# Patient Record
Sex: Female | Born: 1935 | Race: White | Hispanic: No | Smoking: Current some day smoker
Health system: Southern US, Community
[De-identification: ages and names within clinical notes are randomized; demographics above are authoritative.]

## PROBLEM LIST (undated history)

## (undated) DIAGNOSIS — B029 Zoster without complications: Secondary | ICD-10-CM

## (undated) DIAGNOSIS — M169 Osteoarthritis of hip, unspecified: Secondary | ICD-10-CM

## (undated) DIAGNOSIS — F0390 Unspecified dementia without behavioral disturbance: Principal | ICD-10-CM

## (undated) DIAGNOSIS — I951 Orthostatic hypotension: Secondary | ICD-10-CM

## (undated) DIAGNOSIS — G3184 Mild cognitive impairment, so stated: Secondary | ICD-10-CM

## (undated) DIAGNOSIS — C4491 Basal cell carcinoma of skin, unspecified: Secondary | ICD-10-CM

## (undated) DIAGNOSIS — S52509A Unspecified fracture of the lower end of unspecified radius, initial encounter for closed fracture: Secondary | ICD-10-CM

## (undated) DIAGNOSIS — M48061 Spinal stenosis, lumbar region without neurogenic claudication: Secondary | ICD-10-CM

## (undated) DIAGNOSIS — Z136 Encounter for screening for cardiovascular disorders: Secondary | ICD-10-CM

## (undated) DIAGNOSIS — M461 Sacroiliitis, not elsewhere classified: Secondary | ICD-10-CM

## (undated) DIAGNOSIS — I10 Essential (primary) hypertension: Secondary | ICD-10-CM

## (undated) DIAGNOSIS — M1711 Unilateral primary osteoarthritis, right knee: Secondary | ICD-10-CM

## (undated) DIAGNOSIS — F1910 Other psychoactive substance abuse, uncomplicated: Secondary | ICD-10-CM

## (undated) DIAGNOSIS — D7589 Other specified diseases of blood and blood-forming organs: Secondary | ICD-10-CM

## (undated) DIAGNOSIS — Z85828 Personal history of other malignant neoplasm of skin: Secondary | ICD-10-CM

## (undated) DIAGNOSIS — Z7409 Other reduced mobility: Secondary | ICD-10-CM

## (undated) HISTORY — DX: Unspecified dementia without behavioral disturbance: F03.90

## (undated) HISTORY — DX: Spinal stenosis, lumbar region without neurogenic claudication: M48.061

## (undated) HISTORY — DX: Orthostatic hypotension: I95.1

## (undated) HISTORY — DX: Encounter for screening for cardiovascular disorders: Z13.6

## (undated) HISTORY — DX: Other specified diseases of blood and blood-forming organs: D75.89

## (undated) HISTORY — DX: Essential (primary) hypertension: I10

## (undated) HISTORY — DX: Personal history of other malignant neoplasm of skin: Z85.828

## (undated) HISTORY — DX: Mild cognitive impairment, so stated: G31.84

## (undated) HISTORY — DX: Unilateral primary osteoarthritis, right knee: M17.11

## (undated) HISTORY — DX: Zoster without complications: B02.9

## (undated) HISTORY — DX: Other reduced mobility: Z74.09

## (undated) HISTORY — DX: Unspecified fracture of the lower end of unspecified radius, initial encounter for closed fracture: S52.509A

## (undated) HISTORY — DX: Osteoarthritis of hip, unspecified: M16.9

## (undated) HISTORY — DX: Other psychoactive substance abuse, uncomplicated: F19.10

## (undated) HISTORY — PX: BILROTH I PROCEDURE: SHX1231

## (undated) HISTORY — DX: Basal cell carcinoma of skin, unspecified: C44.91

## (undated) HISTORY — DX: Sacroiliitis, not elsewhere classified: M46.1

---

## 1997-08-19 ENCOUNTER — Encounter (INDEPENDENT_AMBULATORY_CARE_PROVIDER_SITE_OTHER): Payer: Self-pay | Admitting: *Deleted

## 1997-08-19 LAB — CONVERTED CEMR LAB

## 1998-06-06 ENCOUNTER — Encounter: Admission: RE | Admit: 1998-06-06 | Discharge: 1998-06-06 | Payer: Self-pay | Admitting: Family Medicine

## 1999-05-15 ENCOUNTER — Encounter: Admission: RE | Admit: 1999-05-15 | Discharge: 1999-05-15 | Payer: Self-pay | Admitting: Sports Medicine

## 1999-06-08 ENCOUNTER — Encounter: Admission: RE | Admit: 1999-06-08 | Discharge: 1999-06-08 | Payer: Self-pay | Admitting: Family Medicine

## 1999-06-15 ENCOUNTER — Encounter: Admission: RE | Admit: 1999-06-15 | Discharge: 1999-06-15 | Payer: Self-pay | Admitting: Family Medicine

## 1999-08-06 ENCOUNTER — Encounter: Admission: RE | Admit: 1999-08-06 | Discharge: 1999-08-06 | Payer: Self-pay | Admitting: Family Medicine

## 2000-05-19 DIAGNOSIS — Z136 Encounter for screening for cardiovascular disorders: Secondary | ICD-10-CM

## 2000-05-19 HISTORY — DX: Encounter for screening for cardiovascular disorders: Z13.6

## 2000-10-10 ENCOUNTER — Encounter: Admission: RE | Admit: 2000-10-10 | Discharge: 2000-10-10 | Payer: Self-pay | Admitting: Family Medicine

## 2000-10-17 DIAGNOSIS — M1711 Unilateral primary osteoarthritis, right knee: Secondary | ICD-10-CM

## 2000-10-17 HISTORY — DX: Unilateral primary osteoarthritis, right knee: M17.11

## 2000-10-18 ENCOUNTER — Encounter: Payer: Self-pay | Admitting: Family Medicine

## 2000-10-18 ENCOUNTER — Encounter: Admission: RE | Admit: 2000-10-18 | Discharge: 2000-10-18 | Payer: Self-pay | Admitting: Family Medicine

## 2000-10-20 ENCOUNTER — Encounter: Payer: Self-pay | Admitting: Family Medicine

## 2000-10-20 ENCOUNTER — Encounter: Admission: RE | Admit: 2000-10-20 | Discharge: 2000-10-20 | Payer: Self-pay | Admitting: Family Medicine

## 2000-10-24 ENCOUNTER — Encounter: Admission: RE | Admit: 2000-10-24 | Discharge: 2000-10-24 | Payer: Self-pay | Admitting: Family Medicine

## 2000-12-01 ENCOUNTER — Encounter: Admission: RE | Admit: 2000-12-01 | Discharge: 2000-12-01 | Payer: Self-pay | Admitting: Family Medicine

## 2001-06-09 ENCOUNTER — Encounter: Admission: RE | Admit: 2001-06-09 | Discharge: 2001-06-09 | Payer: Self-pay | Admitting: Family Medicine

## 2003-05-13 ENCOUNTER — Encounter: Admission: RE | Admit: 2003-05-13 | Discharge: 2003-05-13 | Payer: Self-pay | Admitting: Family Medicine

## 2004-06-04 ENCOUNTER — Ambulatory Visit: Payer: Self-pay | Admitting: Family Medicine

## 2004-06-04 ENCOUNTER — Ambulatory Visit (HOSPITAL_COMMUNITY): Admission: RE | Admit: 2004-06-04 | Discharge: 2004-06-04 | Payer: Self-pay | Admitting: Family Medicine

## 2005-05-20 ENCOUNTER — Ambulatory Visit: Payer: Self-pay | Admitting: Family Medicine

## 2006-03-24 ENCOUNTER — Ambulatory Visit: Payer: Self-pay | Admitting: Family Medicine

## 2006-05-17 ENCOUNTER — Ambulatory Visit: Payer: Self-pay | Admitting: Sports Medicine

## 2006-09-15 DIAGNOSIS — K649 Unspecified hemorrhoids: Secondary | ICD-10-CM | POA: Insufficient documentation

## 2006-09-15 DIAGNOSIS — M810 Age-related osteoporosis without current pathological fracture: Secondary | ICD-10-CM | POA: Insufficient documentation

## 2006-09-15 DIAGNOSIS — M1710 Unilateral primary osteoarthritis, unspecified knee: Secondary | ICD-10-CM | POA: Insufficient documentation

## 2006-09-15 DIAGNOSIS — F17200 Nicotine dependence, unspecified, uncomplicated: Secondary | ICD-10-CM

## 2006-09-15 DIAGNOSIS — E7800 Pure hypercholesterolemia, unspecified: Secondary | ICD-10-CM

## 2006-09-15 DIAGNOSIS — H81399 Other peripheral vertigo, unspecified ear: Secondary | ICD-10-CM | POA: Insufficient documentation

## 2006-09-16 ENCOUNTER — Encounter (INDEPENDENT_AMBULATORY_CARE_PROVIDER_SITE_OTHER): Payer: Self-pay | Admitting: *Deleted

## 2007-12-21 ENCOUNTER — Ambulatory Visit: Payer: Self-pay | Admitting: Family Medicine

## 2007-12-21 LAB — CONVERTED CEMR LAB
AST: 22 units/L (ref 0–37)
Albumin: 4.4 g/dL (ref 3.5–5.2)
BUN: 13 mg/dL (ref 6–23)
CO2: 24 meq/L (ref 19–32)
Calcium: 9.7 mg/dL (ref 8.4–10.5)
Chloride: 105 meq/L (ref 96–112)
Cholesterol: 218 mg/dL — ABNORMAL HIGH (ref 0–200)
Creatinine, Ser: 0.67 mg/dL (ref 0.40–1.20)
Glucose, Bld: 86 mg/dL (ref 70–99)
HCT: 41.4 % (ref 36.0–46.0)
HDL: 68 mg/dL (ref >39)
Hemoglobin: 13.8 g/dL (ref 12.0–15.0)
Potassium: 4.2 meq/L (ref 3.5–5.3)
RBC: 4.44 M/uL (ref 3.87–5.11)
Total CHOL/HDL Ratio: 3.2
Triglycerides: 241 mg/dL — ABNORMAL HIGH (ref <150)
WBC: 5.4 10*3/uL (ref 4.0–10.5)

## 2007-12-22 ENCOUNTER — Encounter: Payer: Self-pay | Admitting: Family Medicine

## 2007-12-22 DIAGNOSIS — I10 Essential (primary) hypertension: Secondary | ICD-10-CM

## 2007-12-27 ENCOUNTER — Telehealth: Payer: Self-pay | Admitting: Family Medicine

## 2008-01-03 ENCOUNTER — Telehealth (INDEPENDENT_AMBULATORY_CARE_PROVIDER_SITE_OTHER): Payer: Self-pay | Admitting: *Deleted

## 2008-01-09 ENCOUNTER — Encounter: Admission: RE | Admit: 2008-01-09 | Discharge: 2008-01-09 | Payer: Self-pay | Admitting: Family Medicine

## 2008-01-10 ENCOUNTER — Telehealth: Payer: Self-pay | Admitting: Family Medicine

## 2008-01-25 ENCOUNTER — Telehealth: Payer: Self-pay | Admitting: Family Medicine

## 2008-02-08 ENCOUNTER — Encounter: Payer: Self-pay | Admitting: Family Medicine

## 2008-03-04 ENCOUNTER — Telehealth: Payer: Self-pay | Admitting: *Deleted

## 2008-03-05 ENCOUNTER — Encounter: Payer: Self-pay | Admitting: Family Medicine

## 2008-03-05 ENCOUNTER — Ambulatory Visit: Payer: Self-pay | Admitting: Family Medicine

## 2008-03-06 LAB — CONVERTED CEMR LAB
BUN: 10 mg/dL (ref 6–23)
CO2: 23 meq/L (ref 19–32)
Calcium: 9.3 mg/dL (ref 8.4–10.5)
Glucose, Bld: 98 mg/dL (ref 70–99)
Sodium: 138 meq/L (ref 135–145)

## 2008-04-15 ENCOUNTER — Ambulatory Visit: Payer: Self-pay | Admitting: Family Medicine

## 2008-06-18 HISTORY — PX: CATARACT EXTRACTION W/ INTRAOCULAR LENS IMPLANT: SHX1309

## 2008-08-08 ENCOUNTER — Inpatient Hospital Stay (HOSPITAL_COMMUNITY): Admission: AD | Admit: 2008-08-08 | Discharge: 2008-08-11 | Payer: Self-pay | Admitting: Orthopedic Surgery

## 2008-11-16 HISTORY — PX: CATARACT EXTRACTION W/ INTRAOCULAR LENS IMPLANT: SHX1309

## 2009-04-18 DIAGNOSIS — B029 Zoster without complications: Secondary | ICD-10-CM

## 2009-04-18 HISTORY — DX: Zoster without complications: B02.9

## 2009-04-25 DIAGNOSIS — B029 Zoster without complications: Secondary | ICD-10-CM | POA: Insufficient documentation

## 2009-05-15 ENCOUNTER — Ambulatory Visit: Payer: Self-pay | Admitting: Family Medicine

## 2009-06-18 HISTORY — PX: ORIF HIP FRACTURE: SHX2125

## 2009-11-10 ENCOUNTER — Ambulatory Visit: Payer: Self-pay | Admitting: Family Medicine

## 2009-11-11 DIAGNOSIS — M48061 Spinal stenosis, lumbar region without neurogenic claudication: Secondary | ICD-10-CM

## 2009-11-11 HISTORY — DX: Spinal stenosis, lumbar region without neurogenic claudication: M48.061

## 2010-03-02 ENCOUNTER — Encounter: Payer: Self-pay | Admitting: Family Medicine

## 2010-03-02 ENCOUNTER — Telehealth: Payer: Self-pay | Admitting: Family Medicine

## 2010-03-27 ENCOUNTER — Encounter: Payer: Self-pay | Admitting: Family Medicine

## 2010-03-30 ENCOUNTER — Encounter: Payer: Self-pay | Admitting: Family Medicine

## 2010-04-30 ENCOUNTER — Ambulatory Visit: Payer: Self-pay | Admitting: Family Medicine

## 2010-04-30 DIAGNOSIS — Z85828 Personal history of other malignant neoplasm of skin: Secondary | ICD-10-CM | POA: Insufficient documentation

## 2010-04-30 DIAGNOSIS — R5381 Other malaise: Secondary | ICD-10-CM | POA: Insufficient documentation

## 2010-04-30 DIAGNOSIS — R5383 Other fatigue: Secondary | ICD-10-CM

## 2010-04-30 HISTORY — DX: Personal history of other malignant neoplasm of skin: Z85.828

## 2010-04-30 LAB — CONVERTED CEMR LAB
AST: 21 units/L (ref 0–37)
BUN: 14 mg/dL (ref 6–23)
Calcium: 9.5 mg/dL (ref 8.4–10.5)
Chloride: 107 meq/L (ref 96–112)
Creatinine, Ser: 0.58 mg/dL (ref 0.40–1.20)
HCT: 38.5 % (ref 36.0–46.0)
HDL: 58 mg/dL (ref >39)
Hemoglobin: 13.2 g/dL (ref 12.0–15.0)
LDL Cholesterol: 103 mg/dL — ABNORMAL HIGH (ref 0–99)
MCV: 94.1 fL (ref 78.0–100.0)
RDW: 13 % (ref 11.5–15.5)
TSH: 2.215 microintl units/mL (ref 0.350–4.500)
Total Bilirubin: 0.6 mg/dL (ref 0.3–1.2)
Total CHOL/HDL Ratio: 3.7
WBC: 5.3 10*3/uL (ref 4.0–10.5)

## 2010-05-01 ENCOUNTER — Encounter: Payer: Self-pay | Admitting: Family Medicine

## 2010-05-15 ENCOUNTER — Telehealth: Payer: Self-pay | Admitting: Family Medicine

## 2010-08-18 NOTE — Assessment & Plan Note (Signed)
Summary: f/u,df   Vital Signs:  Patient profile:   77 year old female Height:      61.5 inches Weight:      129.6 pounds BMI:     24.18 Pulse rate:   96 / minute BP sitting:   135 / 76  (left arm)  Vitals Entered By: Arlyss Repress CMA, (November 10, 2009 2:17 PM) CC: regular visit. check up. blood work. hx of left hip fx Is Patient Diabetic? No Pain Assessment Patient in pain? no        CC:  regular visit. check up. blood work. hx of left hip fx.  History of Present Illness: No major concers except intermittent right low back pain BACK PAIN Location:Right low back over iliac crest Quality:aching Onset:months to years ago Severity (1-10): worst is 6 for short period Worse with:standing Better with:lying down or sitting Radiation:no Trauma:no Best sitting/standing/leaning forward:no change  Red Flags Fecal/urinary incontinence:no Numbness/Weakness:no Fever/chills/sweats:no Night pain:no Unexplained weight loss:no No relief with bedrest:yes h/o cancer/immunosuppression:no IV drug use:no PMH of osteoporosis or chronic steroid use:yes  PMH significant for hx of right sacroilliitis and Left Hip ORIF for subcapital hip fracture (12/10)   Habits & Providers  Alcohol-Tobacco-Diet     Alcohol drinks/day: 0     Tobacco Status: current     Tobacco Counseling: to quit use of tobacco products  Comments: smokes sometimes, when she is upset  Current Medications (verified): 1)  Vitamin C 500 Mg  Tabs (Ascorbic Acid) 2)  Calcium Citrate-Vitamin D 315-200 Mg-Unit  Tabs (Calcium Citrate-Vitamin D) 3)  Multivitamins   Tabs (Multiple Vitamin) .Marland Kitchen.. 1 Daily 4)  Tramadol Hcl 50 Mg Tabs (Tramadol Hcl) .... One Tablet By Mouth Every Six Hours, If Needed, For Arthritis Pain  Allergies: 1)  Vioxx 2)  Hydrochlorothiazide PMH reviewed for relevance, PSH reviewed for relevance  Social History: Smoking Status:  current  Physical Exam  General:  alert and well-developed.   thoracic Kyphosis Lungs:  Normal respiratory effort, . Lungs are clear to auscultation, no crackles or wheezes. Heart:  normal rate, regular rhythm, and no murmur.   Abdomen:  soft and non-tender.   Pulses:  R dorsalis pedis normal and L dorsalis pedis normal.   Extremities:  No peripheral edema Neurologic:  gait normal.   DTR knee and ankles normal  Skin:  Right face malar with hypopigmented thicken  ~1 cm diameter plaque containing a central  telangectasia and an adjoining red papule. Nontender.  Psych:  memory intact for recent and remote, normally interactive, good eye contact, not anxious appearing, and not depressed appearing.  Well groomed   Impression & Recommendations:  Problem # 1:  BACK PAIN, LUMBAR, CHRONIC (ICD-724.2) Assessment Comment Only  Nonspecific low back pain without red flags.   Continue intermittent use of APAP as needed. Trial of Tramadol 1 tab every 6 hours as needed.  If tolerates and finds it helpful patient may request further refills.  Her updated medication list for this problem includes:    Tramadol Hcl 50 Mg Tabs (Tramadol hcl) ..... One tablet by mouth every six hours, if needed, for arthritis pain  Orders: [facility]- Est Level  3 (11914)  Problem # 2:  HYPERTENSION, BENIGN ESSENTIAL (ICD-401.1) Assessment: Unchanged  Adequate control. Tolerating medication. No new organ damage. Plan to continue life styel management.  Orders: [facility]- Est Level  3 ([id number])  Problem # 3:  ? of NEOPLASM, MALIGNANT, SKIN (ICD-173.9)  Right facial malar lesion that patient noted for months  to years, wthat she periodically "picks off" the surface. Ddx includes superficial basal cell or squamous cell cancer. I let Ms Basham know of my concern for possible malignant nature of this facial lesion.  I offered to refer her to dermatologist for evaluation.    she plans to ask her sister' oncologist to look at the lesion when she takes her to see him in the next couple weeks.  She  said she would let me know if she wanted to pursue a dermatologic consultation.  Orders: [facility]- Est Level  3 (69629)  Complete Medication List: 1)  Vitamin C 500 Mg Tabs (Ascorbic acid) 2)  Calcium Citrate-vitamin D 315-200 Mg-unit Tabs (Calcium citrate-vitamin d) 3)  Multivitamins Tabs (Multiple vitamin) .Marland Kitchen.. 1 daily 4)  Tramadol Hcl 50 Mg Tabs (Tramadol hcl) .... One tablet by mouth every six hours, if needed, for arthritis pain  Patient Instructions: 1)  Please schedule a follow-up appointment in 6 months . 2)  Try the Tramadol tablet, one tablet every six hours if you need it for arthritis pain.  If the tramadol helps you without causing side effects, I will be glad to refill the tramadol.  3)  I am concerned that the thickening of skin on your right face cheek could be a basal skin cancer.  I would be glad to arrange an appointment with a Dermatologist, if you like.  Prescriptions: TRAMADOL HCL 50 MG TABS (TRAMADOL HCL) One tablet by mouth every six hours, if needed, for arthritis pain  #10 x 0   Entered and Authorized by:   Tawanna Cooler Jaxson Keener MD   Signed by:   Tawanna Cooler Mckade Gurka MD on 11/11/2009   Method used:   Handwritten   RxID:   [id number]    Prevention & Chronic Care Immunizations   Influenza vaccine: Fluvax MCR  (05/15/2009)   Influenza vaccine due: 04/15/2009    Tetanus booster: 04/15/2008: given   Tetanus booster due: 04/15/2018    Pneumococcal vaccine: Done.  (10/17/1996)   Pneumococcal vaccine deferral: Not indicated  (05/15/2009)   Pneumococcal vaccine due: None    H. zoster vaccine: Not documented  Colorectal Screening   Hemoccult: Done.  (05/19/2005)   Hemoccult due: Not Indicated    Colonoscopy: Normal  (02/08/2008)   Colonoscopy due: 02/07/2018  Other Screening   Pap smear: Done.  (08/19/1997)   Pap smear action/deferral: not indicated  (05/15/2009)   Pap smear due: Not Indicated    Mammogram: refused  (05/15/2009)   Mammogram action/deferral:  patient declined  (05/15/2009)   Mammogram due: Refused  (05/15/2009)    DXA bone density scan: Not documented   DXA scan due: Not Indicated    Smoking status: current  (11/10/2009)   Smoking cessation counseling: yes  (12/21/2007)  Lipids   Total Cholesterol: 218  (12/21/2007)   LDL: 102  (12/21/2007)   LDL Direct: Not documented   HDL: 68  (12/21/2007)   Triglycerides: 241  (12/21/2007)    SGOT (AST): 22  (12/21/2007)   SGPT (ALT): 16  (12/21/2007)   Alkaline phosphatase: 57  (12/21/2007)   Total bilirubin: 0.5  (12/21/2007)    Lipid flowsheet reviewed?: Yes   Progress toward LDL goal: At goal  Hypertension   Last Blood Pressure: 135 / 76  (11/10/2009)   Serum creatinine: 0.58  (03/05/2008)   Serum potassium 4.0  (03/05/2008)    Hypertension flowsheet reviewed?: Yes   Progress toward BP goal: At goal  Self-Management Support :   Personal Goals (by the  next clinic visit) :      Personal blood pressure goal: 160/90  (05/15/2009)     Personal LDL goal: 160  (05/15/2009)    Hypertension self-management support: Not documented    Hypertension self-management support not done because: Good outcomes  (11/10/2009)    Lipid self-management support: Not documented     Lipid self-management support not done because: Good outcomes  (11/10/2009)

## 2010-08-18 NOTE — Progress Notes (Signed)
Summary: pls call   Phone Note Call from Patient Call back at Home Phone [phone]   Caller: Patient Summary of Call: wants to talk to dr Levonne Lapping about results from other doctors Initial call taken by: De Nurse,  March 02, 2010 2:37 PM  Follow-up for Phone Call        No answer at [phone] Follow-up by: Tawanna Cooler Saniyyah Elster MD,  March 02, 2010 3:38 PM  Additional Follow-up for Phone Call Additional follow up Details #1::        No answer at [phone] Additional Follow-up by: Tawanna Cooler Magally Vahle MD,  March 03, 2010 11:24 AM    Additional Follow-up for Phone Call Additional follow up Details #2::    seen by "Back doctor". Told there was no surgery for her back.  he recommended a 'shot in the back" and said a doctor would call  her about the shot.  she is not interested in the shot currently. she says the tramadol makes her feel drunk. Will see pt in October.  Follow-up by: Tawanna Cooler Brenya Taulbee MD,  March 04, 2010 9:27 AM

## 2010-08-18 NOTE — Consult Note (Signed)
Summary: [facility]  SM&OC   Imported By: Clydell Hakim 04/07/2010 12:27:12  _____________________________________________________________________  External Attachment:    Type:   Image     Comment:   External Document

## 2010-08-18 NOTE — Consult Note (Signed)
Summary: [facility]  SM&OC   Imported By: Clydell Hakim 03/06/2010 15:21:45  _____________________________________________________________________  External Attachment:    Type:   Image     Comment:   External Document

## 2010-08-18 NOTE — Progress Notes (Signed)
Summary: fyi delay in derm consultation   Phone Note Call from Patient Call back at Home Phone [phone]   Caller: Patient Summary of Call: just wanted Dr Levonne Lapping to know that she has to resch her appt w/ Derm b/c her son just had a stroke and has to go to [place] to see him. Initial call taken by: De Nurse,  May 15, 2010 10:07 AM

## 2010-08-18 NOTE — Assessment & Plan Note (Signed)
Summary: F/U VISIT/[facility]   FLU SHOT GIVEN TODAY.Jimmy Footman, CMA  April 30, 2010 10:54 AM   Vital Signs:  Patient profile:   77 year old female Height:      61.5 inches Weight:      130.2 pounds BMI:     24.29 Temp:     98.5 degrees F oral Pulse rate:   104 / minute BP sitting:   145 / 85  (right arm) Cuff size:   regular  Vitals Entered By: Jimmy Footman, CMA (April 30, 2010 10:04 AM) CC: f/u, wants flu shot Is Patient Diabetic? No Pain Assessment Patient in pain? no        CC:  f/u and wants flu shot.  History of Present Illness: HYPERTENSION Disease Monitoring   Blood pressure range:not checking at home   Chest pain:no   Dyspnea:no   Claudication:no  Medications   Compliance:not on medications for hypertension  Prevention   Exercise: active with dogs.  Looking after her 34 year old aunt.   Diet pattern:watches weight   Salt restriction:aware of salt content of foods.   Back pain, chronic from osteoarthritis, foraminal stenosis and Scoliosis Pain only occassionally.  Usually able to control pain with rest and either Acetaminophen or occassioanal half talbet of tramadol  Right facial cheek lesion Pt feels it has regressed since applying a medication to the lesion that her relative makes at home.      Habits & Providers  Alcohol-Tobacco-Diet     Alcohol drinks/day: 0     Tobacco Status: current  Current Medications (verified): 1)  Vitamin C 500 Mg  Tabs (Ascorbic Acid) 2)  Calcium Citrate-Vitamin D 315-200 Mg-Unit  Tabs (Calcium Citrate-Vitamin D) 3)  Multivitamins   Tabs (Multiple Vitamin) .Marland Kitchen.. 1 Daily 4)  Tramadol Hcl 50 Mg Tabs (Tramadol Hcl) .... Half A Tablet By Mouth Every Six Hours, If Needed, For Arthritis Pain  Allergies (verified): 1)  Vioxx 2)  Hydrochlorothiazide  Past History:  Past medical history reviewed for relevance to current acute and chronic problems. Past surgical history reviewed for relevance to current acute and chronic  problems.  Past Medical History: Reviewed history from 05/15/2009 and no changes required. Alcoholic Abuse, Very Distant,  Sacroiliitis, R. Colorectal Cancer screening Colonoscopy 3/99 Elsie Amis -, Son with Colon Cancer Coloractal Cancer screening Colonoscopy 2009 - normal Shingles attack  (04/2009)  Past Surgical History: Left Hip ORIF for subcapital hip fracture (12/10)- Dr Thomes Dinning) Cataract extraction and lens implants Left eye 06/2008 and right eye 11/2008 by surgeon at [facility].  S/p Bilroth(partial gastrectomy) for Ulcer,  ETT Dr. Hillary Bow 11/01-adequate and low pb CAD - 06/02/2000,  R. knee & R. Hip XRay: medial narrow&patella spur - 10/27/2000,  R. SI Joint injection w/steroid with good results - 12/07/2000  Physical Exam  General:  alert.  kyphotic.  Cooperative. Well-groomed.  Ears:  mild TM scarring. No bulging or erythema.  Neck:  supple and no masses.   Lungs:  Normal respiratory effort, chest expands symmetrically. Lungs are clear to auscultation, no crackles or wheezes. Heart:  normal rate, regular rhythm, no murmur, and no JVD.   Abdomen:  soft and non-tender.   Pulses:  R dorsalis pedis normal and L dorsalis pedis normal.   Extremities:  No peripheral edema.  Skin:  thicken waxy firm skin area  ~ 1 to 1.5 cm right facial malar area with an erratic telangectasia on lower border of lesion.    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  I had asked patient last visit about consulting a dermatologist for this lesion on last visit.  She declined.  She is now willing to consult a Dermatologist.  We arranged consultation with Dr Pearletha [facility]) for evaluation and treatment of this lesion.  Orders: Dermatology Referral (Derma) [facility]- Est  Level 4 (16109)  Problem # 2:   HYPERTENSION, BENIGN ESSENTIAL (ICD-401.1)  Adequately treated with lifestyle interventions.  No evidence of new end organ injury.  Will monitor for now. RTC in 6 months.  Orders: [facility]- Est  Level 4 (60454)  Problem # 3:  FATIGUE (ICD-780.79)  Nonspecific complaint.  No red flags except facial skin lesion. Checking Lipids, CMET, CBC, TSH.   Orders: Comp Met-[facility] [phone]) TSH-[facility] [phone]) CBC-[facility] ([id number])  Problem # 4:  SHINGLES (ICD-053.9) Assessment: Comment Only Pt with history of shingles.  She is interested in getting Zostavax.  Pt ed material and prescription to obtain Zostavax with administration at [facility].   Problem # 5:  BACK PAIN, LUMBAR, CHRONIC (ICD-724.2)  Adequate pain control with continued ability to perform iADLs with minimal analgesics of APAP or half tablet Tramadol  Her updated medication list for this problem includes:    Tramadol Hcl 50 Mg Tabs (Tramadol hcl) ..... Half a tablet by mouth every six hours, if needed, for arthritis pain  Orders: [facility]- Est  Level 4 ([id number])  Problem # 6:  HYPERCHOLESTEROLEMIA (ICD-272.0) Assessment: Comment Only Check nonfasting LDL-C today.  Orders: Lipid-[facility] ([id number]) TSH-[facility] ([id number])  Complete Medication List: 1)  Vitamin C 500 Mg Tabs (Ascorbic acid) 2)  Calcium Citrate-vitamin D 315-200 Mg-unit Tabs (Calcium citrate-vitamin d) 3)  Multivitamins Tabs (Multiple vitamin) .Marland Kitchen.. 1 daily 4)  Tramadol Hcl 50 Mg Tabs (Tramadol hcl) .... Half a tablet by mouth every six hours, if needed, for arthritis pain  Other Orders: Influenza Vaccine MCR ([id number])   Influenza Vaccine    Vaccine Type: Fluvax MCR    Site: left deltoid    Mfr: Sanofi Pasteur    Dose: 0.5 ml    Route: IM    Given by: Jimmy Footman, CMA    Exp. Date: 01/13/2011    Lot #: [id number]    VIS given: 02/10/10 version given April 30, 2010.  Flu Vaccine Consent Questions    Do you have a history of severe allergic reactions to this  vaccine? no    Any prior history of allergic reactions to egg and/or gelatin? no    Do you have a sensitivity to the preservative Thimersol? no    Do you have a past history of Guillan-Barre Syndrome? no    Do you currently have an acute febrile illness? no    Have you ever had a severe reaction to latex? no    Vaccine information given and explained to patient? yes    Are you currently pregnant? no    Prevention & Chronic Care Immunizations   Influenza vaccine: Fluvax MCR  (04/30/2010)   Influenza vaccine due: 04/15/2009    Tetanus booster: 04/15/2008: given   Tetanus booster due: 04/15/2018    Pneumococcal vaccine: Done.  (10/17/1996)   Pneumococcal vaccine deferral: Not indicated  (05/15/2009)   Pneumococcal vaccine due: None    H. zoster vaccine: Not documented  Colorectal Screening   Hemoccult: Done.  (05/19/2005)   Hemoccult due: Not Indicated  Colonoscopy: Normal  (02/08/2008)   Colonoscopy due: 02/07/2018  Other Screening   Pap smear: Done.  (08/19/1997)   Pap smear action/deferral: not indicated  (05/15/2009)   Pap smear due: Not Indicated    Mammogram: refused  (05/15/2009)   Mammogram action/deferral: patient declined  (05/15/2009)   Mammogram due: Refused  (05/15/2009)    DXA bone density scan: Not documented   DXA scan due: Not Indicated    Smoking status: current  (04/30/2010)   Smoking cessation counseling: yes  (12/21/2007)  Lipids   Total Cholesterol: 218  (12/21/2007)   LDL: 102  (12/21/2007)   LDL Direct: Not documented   HDL: 68  (12/21/2007)   Triglycerides: 241  (12/21/2007)    SGOT (AST): 22  (12/21/2007)   SGPT (ALT): 16  (12/21/2007) CMP ordered    Alkaline phosphatase: 57  (12/21/2007)   Total bilirubin: 0.5  (12/21/2007)    Lipid flowsheet reviewed?: Yes   Progress toward LDL goal: At goal  Hypertension   Last Blood Pressure: 145 / 85  (04/30/2010)   Serum creatinine: 0.58  (03/05/2008)   Serum potassium 4.0   (03/05/2008) CMP ordered     Hypertension flowsheet reviewed?: Yes   Progress toward BP goal: At goal  Self-Management Support :   Personal Goals (by the next clinic visit) :      Personal blood pressure goal: 160/90  (05/15/2009)     Personal LDL goal: 130  (04/30/2010)    Hypertension self-management support: Not documented    Hypertension self-management support not done because: Good outcomes  (04/30/2010)    Lipid self-management support: Not documented     Lipid self-management support not done because: Good outcomes  (04/30/2010)

## 2010-08-18 NOTE — Letter (Signed)
Summary: [facility] Lipid Letter  [facility]  [address]   [place], [place] [address]   Phone: [phone]  Fax: [phone]    05/01/2010 MRN: [id number]  Kathryn [name] [address] [place] [address] [place]  [address]  Dear [name]:  I reviewed your last lipid profile from 04/30/2010 and the results are noted below with a summary of recommendations for lipid management. Your Cholesterol panel looks very good.  You have plenty of good HDL cholesterol and not very much bad LDL cholesterol.   The Triglycerides were only slightly high.  Triglycerides cause only a minor risk for heart disease.  Triglycerides can also be slightly high if you had not been fasting for 8 hours prior to this blood draw.   I recommend rechecking your cholesterol in a 2 years.  Keep up the good work.    Cholesterol:       213     Goal: <240   HDL "good" Cholesterol:   58     Goal: >40   LDL "bad" Cholesterol:   103     Goal: <130   Triglycerides:       258     Goal: <150       TLC Diet (Therapeutic Lifestyle Change): Saturated Fats & Transfatty acids should be kept < 7% of total calories ***Reduce Saturated Fats Polyunstaurated Fat can be up to 10% of total calories Monounsaturated Fat Fat can be up to 20% of total calories Total Fat should be no greater than 25-35% of total calories Carbohydrates should be 50-60% of total calories Protein should be approximately 15% of total calories Fiber should be at least 20-30 grams a day ***Increased fiber may help lower LDL Total Cholesterol should be < 200mg /day Consider adding plant stanol/sterols to diet (example: Benacol spread) ***A higher intake of unsaturated fat may reduce Triglycerides and Increase HDL   If you have any questions, please call. We appreciate being able to work with you.   Sincerely,   Kathryn [name] Kathryn Fogleman MD [facility] Kathryn [name] Kathryn Etheredge MD     Appended Document: [facility] Lipid Letter mailed.

## 2010-08-18 NOTE — Letter (Signed)
Summary: Reminder to make check-up appointment with McDiarmid  [facility]  [address]   [place], [place] [address]   Phone: [phone]  Fax: [phone]    03/27/2010 MRN: [id number]  [address] [place], [place]  [address]  Dear Ms. Barbuto,  I hope you are doing well. I wanted to remind you to call the [facility] to schedule a check-up appointment for October with me.   Sincerely,   Tawanna Cooler McDiarmid MD [facility]  Appended Document: Reminder to make check-up appointment with McDiarmid mailed.

## 2010-08-24 ENCOUNTER — Encounter: Payer: Self-pay | Admitting: *Deleted

## 2010-08-28 ENCOUNTER — Encounter: Payer: Self-pay | Admitting: Home Health Services

## 2010-10-12 ENCOUNTER — Encounter: Payer: Self-pay | Admitting: Family Medicine

## 2010-10-12 ENCOUNTER — Ambulatory Visit (INDEPENDENT_AMBULATORY_CARE_PROVIDER_SITE_OTHER): Payer: Medicare Other | Admitting: Family Medicine

## 2010-10-12 DIAGNOSIS — I10 Essential (primary) hypertension: Secondary | ICD-10-CM

## 2010-10-12 DIAGNOSIS — C4491 Basal cell carcinoma of skin, unspecified: Secondary | ICD-10-CM

## 2010-10-13 ENCOUNTER — Encounter: Payer: Self-pay | Admitting: Family Medicine

## 2010-10-13 DIAGNOSIS — C4491 Basal cell carcinoma of skin, unspecified: Secondary | ICD-10-CM | POA: Insufficient documentation

## 2010-10-13 NOTE — Assessment & Plan Note (Signed)
Adequate blood pressure control.  No evidence of new end organ damage.   Plan to continue lifestyle management of blood pressure.  Will need BMET and LDL next OV in 6 months.

## 2010-10-13 NOTE — Progress Notes (Signed)
  Subjective:    Patient ID: Kathryn [name], female    DOB: February 21, 1925, 77 y.o.   MRN: [id number]  HPI HYPERTENSION  Disease Monitoring  Blood pressure range:not checking at home  Chest pain:no  Dyspnea:no  Claudication:no  Medications  Compliance:not on medications for hypertension  Prevention  Exercise: active with dogs. Looking after her 59 year old aunt.  Diet pattern:watches weight  Salt restriction:aware of salt content of foods.  Back pain, chronic from osteoarthritis, foraminal stenosis and Scoliosis  Pain only occassionally. Usually able to control pain with rest and either Acetaminophen  Right facial cheek lesion S/P excision by Dr Lupton(Derm)- some form of skin caner "that grows out, not in"  May have bee a Basal Cell cancer.   Back pain, chronic form osteoarthritis, foraminal stenosis and scoliosis Pain across low back intermittently,  Improves with rest and heat Occasionally takes tylenol with good pain control Minimal right knee pain.   Medications, past medical history,  family history, social history were reviewed and updated.     Review of Systems     Objective:   Physical Exam        Assessment & Plan:

## 2010-11-02 LAB — COMPREHENSIVE METABOLIC PANEL
ALT: 16 U/L (ref 0–35)
AST: 26 U/L (ref 0–37)
Albumin: 4 g/dL (ref 3.5–5.2)
Alkaline Phosphatase: 88 U/L (ref 39–117)
Potassium: 3.8 mEq/L (ref 3.5–5.1)
Sodium: 140 mEq/L (ref 135–145)
Total Protein: 6.8 g/dL (ref 6.0–8.3)

## 2010-11-02 LAB — TYPE AND SCREEN: Antibody Screen: NEGATIVE

## 2010-11-02 LAB — CBC
HCT: 33.4 % — ABNORMAL LOW (ref 36.0–46.0)
HCT: 34 % — ABNORMAL LOW (ref 36.0–46.0)
Hemoglobin: 11.1 g/dL — ABNORMAL LOW (ref 12.0–15.0)
Hemoglobin: 11.6 g/dL — ABNORMAL LOW (ref 12.0–15.0)
MCHC: 33.1 g/dL (ref 30.0–36.0)
MCV: 95.7 fL (ref 78.0–100.0)
Platelets: 240 10*3/uL (ref 150–400)
RBC: 3.49 MIL/uL — ABNORMAL LOW (ref 3.87–5.11)
RDW: 12.9 % (ref 11.5–15.5)
RDW: 13.4 % (ref 11.5–15.5)
WBC: 5.6 10*3/uL (ref 4.0–10.5)

## 2010-11-02 LAB — GLUCOSE, CAPILLARY

## 2010-11-02 LAB — BASIC METABOLIC PANEL
CO2: 27 mEq/L (ref 19–32)
Chloride: 102 mEq/L (ref 96–112)
GFR calc Af Amer: >60 mL/min (ref >60)
GFR calc non Af Amer: >60 mL/min (ref >60)
Glucose, Bld: 101 mg/dL — ABNORMAL HIGH (ref 70–99)
Potassium: 3.7 mEq/L (ref 3.5–5.1)
Potassium: 3.7 mEq/L (ref 3.5–5.1)
Sodium: 133 mEq/L — ABNORMAL LOW (ref 135–145)
Sodium: 138 mEq/L (ref 135–145)

## 2010-11-02 LAB — URINE MICROSCOPIC-ADD ON

## 2010-11-02 LAB — URINALYSIS, ROUTINE W REFLEX MICROSCOPIC
Hgb urine dipstick: NEGATIVE
Protein, ur: NEGATIVE mg/dL
Urobilinogen, UA: 1 mg/dL (ref 0.0–1.0)

## 2010-11-02 LAB — ABO/RH: ABO/RH(D): A POS

## 2010-11-02 LAB — PROTIME-INR: INR: 1 (ref 0.00–1.49)

## 2010-12-01 NOTE — Op Note (Signed)
NAME:  Kathryn [name], Kathryn [name] NO.:  [id number]   MEDICAL RECORD NO.:  [id number]          PATIENT TYPE:  INP   LOCATION:  5019                         FACILITY:  [facility]   PHYSICIAN:  Dyke Brackett, M.D.    DATE OF BIRTH:  1925/02/10   DATE OF PROCEDURE:  DATE OF DISCHARGE:                               OPERATIVE REPORT   INDICATIONS FOR PROCEDURE:  This is an 77 year old who presented to the  office with a 2-week history of hip pain after a fall, seen outside  Urgent Care here.  X-rays were equivocal, but then read out by  Radiology, film was consistent with a femoral neck fracture in my office  with obvious hip pain.  X-rays showed a subtle, but really impacted  valgus neck fractures.  She was counseled the best chance of dealing  would be with cannulated screws.   PREOPERATIVE DIAGNOSIS:  Valgus impacted left femoral neck fracture.   POSTOPERATIVE DIAGNOSIS:  Valgus impacted left femoral neck fracture.   OPERATIONS:  Percutaneous pinning, left femoral neck fracture.   SURGEON:  Dyke Brackett, M.D.   GENERAL BLOOD LOSS:  Minimal.   DESCRIPTION OF SURGERY:  General anesthetic, placed on the fractured  table, foot slightly internally rotated.  A 45-cm incision was made just  below the lesser trochanter.  Splitting of the iliotibial band, three  7.3 Synthes screws were placed in a triangular-shaped fashion, and all  abutting the superior right main quadrant of the femoral head, which  compressed and stabilized the fracture nicely.  The wound was irrigated  and closed with 0 and 2-0 Vicryl, Marcaine without epinephrine 10 mL,  taken to recovery room in stable condition.      Dyke Brackett, M.D.  Electronically Signed     WDC/MEDQ  D:  08/09/2008  T:  08/10/2008  Job:  [id number]

## 2010-12-04 NOTE — Discharge Summary (Signed)
NAMEJADELIN, ENG                  ACCOUNT NO.:  [id number]   MEDICAL RECORD NO.:  [id number]          PATIENT TYPE:  INP   LOCATION:  5019                         FACILITY:  [facility]   PHYSICIAN:  Dyke Brackett, M.D.    DATE OF BIRTH:  15-Apr-1925   DATE OF ADMISSION:  08/08/2008  DATE OF DISCHARGE:  08/11/2008                               DISCHARGE SUMMARY   Date of procedure was on August 09, 2008, with left hip pinning done by  Dr. Madelon Lips, no PA assistant.   GENERAL HOSPITAL COURSE:  The patient was admitted to the hospital on  August 08, 2008, with 2 weeks of left hip pain, noted a nondisplaced  femoral neck fracture.  On August 09, 2008, the patient had a left hip  femoral neck pinning done by Dr. Madelon Lips.  The patient was transferred  to the PACU in stable condition and then up to 5000 orthopedic floor.  Hospital course on first postop day #1, the patient was stable, vital  signs stable, afebrile, hemoglobin 11.6, PT 15.1, INR 1.2.  The patient  alert and oriented x3, doing well.  On postoperative day #2 was August 11, 2008, the patient had minimal pain up, walking well, 50%  weightbearing.  Vital signs were stable, afebrile, hemoglobin 11.1.  Left lower extremity neurovascularly intact.  The patient's plan  therefore was discharged home on August 11, 2008, on postoperative day  #2.   ASSESSMENT AND PLAN:  Ms. Luba is an 77 year old female status post  left femoral neck pinning discharged home 50% weightbearing.  Plan to  follow up in the office in about 10 days for staple removal.  Due to do  Home Health physical therapy until we see her in the office.  Discharged  home where vital signs stable, afebrile, and her hemoglobin stable at  11.1.   Discharge home medications were as follows:  1. Multivitamin.  2. Vitamin C.  3. Calcium.  4. Percocet 5/325 one to two tablets p.o. q.4-6 hours p.r.n. pain.  5. Robaxin 500 mg 1 tablet p.o. q.6-8 hours p.r.n. pain.  6.  Lovenox 40 mg subcu at 8 a.m. each day for a total of 14 days      postoperatively.      Sharol Given, PA      Dyke Brackett, M.D.  Electronically Signed    JBS/MEDQ  D:  09/20/2008  T:  09/20/2008  Job:  [id number]

## 2011-04-01 ENCOUNTER — Ambulatory Visit (INDEPENDENT_AMBULATORY_CARE_PROVIDER_SITE_OTHER): Payer: Medicare Other | Admitting: Family Medicine

## 2011-04-01 VITALS — BP 150/88 | HR 99 | Temp 97.9°F | Wt 127.4 lb

## 2011-04-01 DIAGNOSIS — H6590 Unspecified nonsuppurative otitis media, unspecified ear: Secondary | ICD-10-CM

## 2011-04-01 DIAGNOSIS — I10 Essential (primary) hypertension: Secondary | ICD-10-CM

## 2011-04-01 DIAGNOSIS — Z23 Encounter for immunization: Secondary | ICD-10-CM

## 2011-04-01 DIAGNOSIS — H905 Unspecified sensorineural hearing loss: Secondary | ICD-10-CM | POA: Insufficient documentation

## 2011-04-01 DIAGNOSIS — H6591 Unspecified nonsuppurative otitis media, right ear: Secondary | ICD-10-CM | POA: Insufficient documentation

## 2011-04-01 LAB — BASIC METABOLIC PANEL
CO2: 27 mEq/L (ref 19–32)
Chloride: 104 mEq/L (ref 96–112)
Sodium: 141 mEq/L (ref 135–145)

## 2011-04-01 NOTE — Assessment & Plan Note (Signed)
Suspect right otitis media with effusion, resolving. Pt should contact us if fails to show resolution of ear fullness sensation.

## 2011-04-01 NOTE — Assessment & Plan Note (Signed)
Adequate BP control based on home BP readings.  No clinical symptoms of new end organ damage.  Plan to continue lifestyle management.  Need BMET next OV to look for evidence renal end-organ effects.

## 2011-04-01 NOTE — Progress Notes (Signed)
  Subjective:    Patient ID: Kathryn [name], female    DOB: 12-19-1924, 77 y.o.   MRN: [id number]  HPI HYPERTENSION  Disease Monitoring  Blood pressure range: running less than 140/90 on home BP monitoring.  Chest pain:no  Dyspnea:no  Claudication:no  Medications  Compliance:not on medications for hypertension  Prevention  Exercise: active with dogs.  Diet pattern:watches weight  Salt restriction:aware of salt content of foods.   Ear fullness, right  Onset approximately 2 weeks ago of right ear fullness. No nasal congestion. Was seen at urgent care by Dr. Clearance Coots. Found to have high frequency hearing loss of moderate degree and mild low frequency hearing loss bilaterally in years. No prescriptions provided. Patient denies significant decrease in the quality of life to 2 hearing impairment. She's not interested in pursuing further workup or hearing aids.  No fever, no sore throat, no pain in the ears, no jaw pain, no headache. Medications, past medical history,  family history, social history were reviewed and updated.   Review of Systems see history of present illness    Objective:   Physical Exam  Constitutional: She appears well-nourished.  HENT:  Ears:  Neck: No thyromegaly present.  Cardiovascular: Normal rate, regular rhythm, normal heart sounds and normal pulses.  Exam reveals no gallop.   No murmur heard. Pulmonary/Chest: Effort normal and breath sounds normal.  Musculoskeletal: She exhibits no edema.  Psychiatric: Her speech is normal and behavior is normal. Thought content normal. Cognition and memory are normal.          Assessment & Plan:

## 2011-04-02 ENCOUNTER — Encounter: Payer: Self-pay | Admitting: Family Medicine

## 2011-05-25 ENCOUNTER — Encounter: Payer: Self-pay | Admitting: Home Health Services

## 2011-12-16 ENCOUNTER — Ambulatory Visit (INDEPENDENT_AMBULATORY_CARE_PROVIDER_SITE_OTHER): Payer: Medicare Other | Admitting: Family Medicine

## 2011-12-16 ENCOUNTER — Encounter: Payer: Self-pay | Admitting: Family Medicine

## 2011-12-16 VITALS — BP 145/81 | HR 87 | Temp 98.3°F | Ht 62.5 in | Wt 122.0 lb

## 2011-12-16 DIAGNOSIS — I10 Essential (primary) hypertension: Secondary | ICD-10-CM

## 2011-12-16 DIAGNOSIS — H6120 Impacted cerumen, unspecified ear: Secondary | ICD-10-CM

## 2011-12-16 DIAGNOSIS — M1710 Unilateral primary osteoarthritis, unspecified knee: Secondary | ICD-10-CM

## 2011-12-16 DIAGNOSIS — F17200 Nicotine dependence, unspecified, uncomplicated: Secondary | ICD-10-CM

## 2011-12-16 NOTE — Patient Instructions (Signed)
Your blood pressure is good.  It sounds like you have a good plan for handling your arthritis.  The Tylenol is a safe pain medication to use as long as your do not take more than 3,000 milligrams a day.   I would like to see you back in September or October.  Will check blood work then.

## 2011-12-17 ENCOUNTER — Encounter: Payer: Self-pay | Admitting: Family Medicine

## 2011-12-17 NOTE — Assessment & Plan Note (Signed)
Adequate BP control based on home BP readings.  No clinical symptoms of new end organ damage.  Plan to continue lifestyle management.  Need BMET next OV to look for evidence renal end-organ effects.  

## 2011-12-17 NOTE — Assessment & Plan Note (Signed)
Adequate symptom control with acetaminophen and periodic rest. Monitor for now.

## 2011-12-17 NOTE — Assessment & Plan Note (Signed)
Not interested in quitting at this time.  Offered assistance.  Revisit on next OFFICE VISIT.

## 2011-12-17 NOTE — Progress Notes (Signed)
  Subjective:    Patient ID: Kathryn [name], female    DOB: July 19, 1925, 78 y.o.   MRN: [id number]  HPI Disease Monitoring  Blood pressure range: running less than 140/90 on home BP monitoring.  Chest pain:no  Dyspnea:no  Claudication:no  Medications  Compliance:not on medications for hypertension  Prevention  Exercise: active with dogs.  Diet pattern:watches weight  Salt restriction:aware of salt content of foods.   Osteoarthritis Intermittent back and knee and finger pain Responds to acetaminophen as needed and rest.  Able to perform activities that she wants to.      Review of Systems     Objective:   Physical Exam  Constitutional: Vital signs are normal. She is cooperative. No distress.  Neck: No mass present.  Cardiovascular: Normal rate, regular rhythm and normal heart sounds.   No murmur heard. Pulmonary/Chest: Effort normal and breath sounds normal.  Abdominal: Soft. Bowel sounds are normal. She exhibits no distension. There is no hepatosplenomegaly. There is no tenderness.  Genitourinary: There is breast swelling. No breast tenderness. Pelvic exam was performed with patient supine.  Lymphadenopathy:    She has no cervical adenopathy.  Neurological: She is alert.  Psychiatric: She has a normal mood and affect. Her speech is normal and behavior is normal. Judgment and thought content normal. Cognition and memory are normal. Cognition and memory are not impaired.          Assessment & Plan:

## 2012-04-17 ENCOUNTER — Ambulatory Visit (INDEPENDENT_AMBULATORY_CARE_PROVIDER_SITE_OTHER): Payer: Medicare Other | Admitting: Family Medicine

## 2012-04-17 ENCOUNTER — Encounter: Payer: Self-pay | Admitting: Family Medicine

## 2012-04-17 VITALS — BP 145/85 | HR 94 | Temp 98.3°F | Ht 62.5 in | Wt 123.3 lb

## 2012-04-17 DIAGNOSIS — E785 Hyperlipidemia, unspecified: Secondary | ICD-10-CM

## 2012-04-17 DIAGNOSIS — Z23 Encounter for immunization: Secondary | ICD-10-CM

## 2012-04-17 DIAGNOSIS — I10 Essential (primary) hypertension: Secondary | ICD-10-CM

## 2012-04-17 DIAGNOSIS — Z79899 Other long term (current) drug therapy: Secondary | ICD-10-CM

## 2012-04-17 DIAGNOSIS — M810 Age-related osteoporosis without current pathological fracture: Secondary | ICD-10-CM

## 2012-04-17 NOTE — Patient Instructions (Addendum)
Your blood pressure looks good.  We are checking your kidney and sugar and electrolytes today.

## 2012-04-18 ENCOUNTER — Encounter: Payer: Self-pay | Admitting: Family Medicine

## 2012-04-18 LAB — BASIC METABOLIC PANEL
BUN: 12 mg/dL (ref 6–23)
CO2: 32 mEq/L (ref 19–32)
Chloride: 103 mEq/L (ref 96–112)
Creat: 0.62 mg/dL (ref 0.50–1.10)
Potassium: 4 mEq/L (ref 3.5–5.3)

## 2012-04-18 NOTE — Assessment & Plan Note (Signed)
[place]. Monitor.

## 2012-04-18 NOTE — Assessment & Plan Note (Signed)
Adequate blood pressure control.  No evidence of new end organ damage.    Plan to continue lifestylemanagement.

## 2012-04-18 NOTE — Progress Notes (Signed)
  Subjective:    Patient ID: LYNORA DYMOND, female    DOB: 02/08/1925, 78 y.o.   MRN: [id number]  Hypertension   Disease Monitoring  Blood pressure range: running less than 140/90 on home BP monitoring.  Chest pain:no  Dyspnea:no  Claudication:no  Medications  Compliance:not on medications for hypertension  Prevention  Exercise: active with dogs.  Diet pattern:watches weight  Salt restriction:aware of salt content of foods.   Osteoarthritis Intermittent back and knee and finger pain Responds to acetaminophen as needed and rest.  Able to perform activities that she wants to.      Review of Systems      Objective:   Physical Exam  Constitutional: Vital signs are normal. She is cooperative. No distress.  Cardiovascular: Normal rate, regular rhythm and normal heart sounds.   No murmur heard. Pulmonary/Chest: Effort normal and breath sounds normal.  Abdominal: Soft. Bowel sounds are normal. There is no hepatosplenomegaly.  Genitourinary: Pelvic exam was performed with patient supine.  Musculoskeletal: She exhibits no edema.  Lymphadenopathy:    She has no cervical adenopathy.  Neurological: She is alert.  Psychiatric: She has a normal mood and affect. Her speech is normal and behavior is normal. Judgment and thought content normal. Cognition and memory are normal. Cognition and memory are not impaired.          Assessment & Plan:

## 2012-07-17 ENCOUNTER — Ambulatory Visit (INDEPENDENT_AMBULATORY_CARE_PROVIDER_SITE_OTHER): Payer: Medicare Other | Admitting: Family Medicine

## 2012-07-17 ENCOUNTER — Ambulatory Visit: Payer: Medicare Other

## 2012-07-17 VITALS — BP 166/101 | HR 114 | Temp 97.7°F | Resp 16 | Ht 62.0 in | Wt 125.0 lb

## 2012-07-17 DIAGNOSIS — S0083XA Contusion of other part of head, initial encounter: Secondary | ICD-10-CM

## 2012-07-17 DIAGNOSIS — S52501A Unspecified fracture of the lower end of right radius, initial encounter for closed fracture: Secondary | ICD-10-CM

## 2012-07-17 DIAGNOSIS — M79609 Pain in unspecified limb: Secondary | ICD-10-CM

## 2012-07-17 DIAGNOSIS — M25539 Pain in unspecified wrist: Secondary | ICD-10-CM

## 2012-07-17 DIAGNOSIS — M79639 Pain in unspecified forearm: Secondary | ICD-10-CM

## 2012-07-17 DIAGNOSIS — S0003XA Contusion of scalp, initial encounter: Secondary | ICD-10-CM

## 2012-07-17 NOTE — Progress Notes (Signed)
Subjective: About 3 weeks ago the patient had a fall, landing on her left hand. She had a little abrasion on the hand and pain in her wrist. It hurt but it was doing better. Therefore days ago she again fell. Landing on her same arm. She was on the back. She hit her head and face. No loss of consciousness. She has gotten a little swelling of her cheek. Her right hand continues to hurt her. She finally came in to get checked other injuries. She has not had falls for long time. She used to crutches and walker at times in the past, but doesn't use them much now. Her daughter brought her in today.  Objective: Pleasant elderly lady alert and oriented. Eyes PERRLA. Has a contusion of her right maxilla with a little swelling. It's only mildly tender. Her right hand has good grip. Good. She is swollen compared to the left arm. She has a couple of abrasions which are crusted over. They are more on the lateral aspect of the back of the hand. She has tenderness across the wrist joint but she moves around very surprisingly well. Forearm and elbow seem okay. She says she has pain up to mid forearm.  [facility] reading (PRIMARY) by  Dr. Alwyn Ren Fracture wrist distal radius, probably an impacted a little bit.  Assessment: Pain right wrist Pain right elbow Fracture right distal radius Contusion right cheek  Plan: Spoke with consultant, Dr. Mack Hook. He thinks he can see her on Thursday or Friday. We will make that referral.  Splint will be applied by one of the PAs Benny Lennert)  Patient says Tylenol will be fine for pain.  Cheekbone is fine.  Suggested that she use a cane.  Marland Kitchen

## 2012-07-17 NOTE — Progress Notes (Signed)
Short arm splint placed on R arm.

## 2012-07-17 NOTE — Patient Instructions (Addendum)
Referral will be made to Dr. Mack Hook at [facility].  Take Tylenol for the pain.

## 2012-07-24 ENCOUNTER — Ambulatory Visit (HOSPITAL_COMMUNITY)
Admission: RE | Admit: 2012-07-24 | Discharge: 2012-07-24 | Disposition: A | Discharge status: Home / Self Care | Payer: Medicare Other | Source: Ambulatory Visit | Attending: Family Medicine | Admitting: Family Medicine

## 2012-07-24 ENCOUNTER — Ambulatory Visit (INDEPENDENT_AMBULATORY_CARE_PROVIDER_SITE_OTHER): Payer: Medicare Other | Admitting: Family Medicine

## 2012-07-24 VITALS — BP 128/70

## 2012-07-24 DIAGNOSIS — R4189 Other symptoms and signs involving cognitive functions and awareness: Secondary | ICD-10-CM

## 2012-07-24 DIAGNOSIS — R55 Syncope and collapse: Secondary | ICD-10-CM

## 2012-07-24 DIAGNOSIS — F09 Unspecified mental disorder due to known physiological condition: Secondary | ICD-10-CM

## 2012-07-24 DIAGNOSIS — R413 Other amnesia: Secondary | ICD-10-CM

## 2012-07-24 DIAGNOSIS — Z9181 History of falling: Secondary | ICD-10-CM

## 2012-07-24 DIAGNOSIS — R296 Repeated falls: Secondary | ICD-10-CM

## 2012-07-24 DIAGNOSIS — M818 Other osteoporosis without current pathological fracture: Secondary | ICD-10-CM

## 2012-07-24 DIAGNOSIS — R079 Chest pain, unspecified: Secondary | ICD-10-CM | POA: Insufficient documentation

## 2012-07-24 DIAGNOSIS — S5290XA Unspecified fracture of unspecified forearm, initial encounter for closed fracture: Secondary | ICD-10-CM

## 2012-07-24 LAB — COMPREHENSIVE METABOLIC PANEL
ALT: 14 U/L (ref 0–35)
Alkaline Phosphatase: 94 U/L (ref 39–117)
CO2: 27 mEq/L (ref 19–32)
Creat: 0.74 mg/dL (ref 0.50–1.10)
Total Bilirubin: 0.6 mg/dL (ref 0.3–1.2)

## 2012-07-24 LAB — TSH: TSH: 2.198 u[IU]/mL (ref 0.350–4.500)

## 2012-07-24 LAB — CBC
MCH: 31.7 pg (ref 26.0–34.0)
MCHC: 34.4 g/dL (ref 30.0–36.0)
MCV: 92.1 fL (ref 78.0–100.0)
Platelets: 255 10*3/uL (ref 150–400)
RDW: 13.8 % (ref 11.5–15.5)

## 2012-07-24 NOTE — Patient Instructions (Signed)
We will be checking your blood work today to see if there was an explanation for your passing out. We will be checking an Echocardiogram to see if your heart could have been involved with your passing out.  We are scheduling you in our Geriatric Clinic to look into your problems with your memory and your falls.  Your blood pressure does drop when you stand up, so go from lying down to sitting to standing very slowly.  Fast changes in position could make you more likely to fall.

## 2012-07-25 ENCOUNTER — Encounter: Payer: Self-pay | Admitting: Family Medicine

## 2012-07-25 DIAGNOSIS — F0391 Unspecified dementia with behavioral disturbance: Secondary | ICD-10-CM | POA: Insufficient documentation

## 2012-07-25 DIAGNOSIS — F03A0 Unspecified dementia, mild, without behavioral disturbance, psychotic disturbance, mood disturbance, and anxiety: Secondary | ICD-10-CM

## 2012-07-25 DIAGNOSIS — F0390 Unspecified dementia without behavioral disturbance: Secondary | ICD-10-CM

## 2012-07-25 HISTORY — DX: Unspecified dementia without behavioral disturbance: F03.90

## 2012-07-25 HISTORY — DX: Unspecified dementia, mild, without behavioral disturbance, psychotic disturbance, mood disturbance, and anxiety: F03.A0

## 2012-07-25 NOTE — Assessment & Plan Note (Signed)
Right radial fracture after two recent falls. First fall was associated with syncope, second appears to have been mechanical  Lab Results  Component Value Date   WBC 6.5 07/24/2012   HGB 13.6 07/24/2012   HCT 39.5 07/24/2012   MCV 92.1 07/24/2012   PLT 255 07/24/2012   CMP     Component Value Date/Time   NA 138 07/24/2012 1550   K 4.1 07/24/2012 1550   CL 104 07/24/2012 1550   CO2 27 07/24/2012 1550   GLUCOSE 90 07/24/2012 1550   BUN 16 07/24/2012 1550   CREATININE 0.74 07/24/2012 1550   CREATININE 0.58 04/30/2010 1852   CALCIUM 10.1 07/24/2012 1550   PROT 6.8 07/24/2012 1550   ALBUMIN 4.6 07/24/2012 1550   AST 24 07/24/2012 1550   ALT 14 07/24/2012 1550   ALKPHOS 94 07/24/2012 1550   BILITOT 0.6 07/24/2012 1550   GFRNONAA >60 08/11/2008 0345   GFRAA  Value: >60        The eGFR has been calculated using the MDRD equation. This calculation has not been validated in all clinical situations. eGFR's persistently <60 mL/min signify possible Chronic Kidney Disease. 08/11/2008 0345   EKG(07/24/12): NSR, Normal EKG Vit D 25(OH) = 32 (WNL)  Echocardiogram - pending.   Uncertain the origin of the syncopal event.  No red flag findings as of yet.  Await Echo.   Pt does have a significant drop in SBP (150 to 128) with standing with associated lightheadness sensation. Would not treat sitting BP in this pt.  Encouraged slow transitions.  The patient appears to have some general lower extremity weakness.  She may benefit from Physical Therapy evaluation and treatment.  Ordered Home Safety evaluation.  Her balance without stress appears adequate.  By history, she may have difficulty shifting her center of balance if she is attempting to carry heavy/bulking objects.   Will order DEXA scan to look for evidence of osteoporosis. Will need to assess calcium and vitamin d intake next visit.

## 2012-07-25 NOTE — Progress Notes (Signed)
  Subjective:    Patient ID: Kathryn [name], female    DOB: Jan 18, 1925, 79 y.o.   MRN: [id number]  HPI  Falls  Dgt was present for interview.   Pt with two falls in last month First fall couple weeks ago.  Pt was at her Aunt's home.  Walking around bed, passed out, right arm struck or got stuck in bed frame.  Did not seek care.  Right forearm began hurting.  No prodromal warning.  No chest pain, no palpitations.  No weakness after event. No sickness prior to event.  No tongue bite. No prescription medications.  No herbal medications. No shortness of breath. No alcohol. No diarrhea. No nausea/vomiting. No melena/hematochezia.   Recent fall while on family deck lifting a chair. Overbalanced and struck her right forearm against the ground.  No head trauma. Right forearm began hurting more.   Does feel more weak in the legs than she used to.  Feels less balanced than she used to. No true vertigo. Gets lightheaded with rapid changes in position.  Dogs in home. Report  Concern about memory Difficulty with remembering how to perform more complex sequenced tasks.  Difficulty with short-term memory.    Review of Systems See HPI     Objective:   Physical Exam Gen: Alert, cooperative, thin, groomed, social Lungs: BCTA Cor: RRR, no m/g/r.  No JVD Abdomen: soft, nontender, nd, (+) BS, No HSM Ext: no edema Psych: congruent affect and mood.  Not anxious or depressed. Speech prosodic and language goal-directed and rational. Min-Cog: Failed. 0/3 recall and failed clock draw. Get up and Go Test: 12 seconds.  Rhomberg: No sway. Sit-to-Stand x 5; Last repetition was difficult. Thoracic Scoliotic and thoracic Kyphotic.       Assessment & Plan:

## 2012-07-25 NOTE — Assessment & Plan Note (Signed)
Pt failed Mini-Cog recall and clock draw. Pt to return for Comprehensive Geriatric Assessment in Geriatric Clinic with particular attention to Dementia evaluation and Fall prevention. TSH/Vit B12/CBC/CMET pending.

## 2012-07-27 ENCOUNTER — Ambulatory Visit (HOSPITAL_COMMUNITY)
Admission: RE | Admit: 2012-07-27 | Discharge: 2012-07-27 | Disposition: A | Discharge status: Home / Self Care | Payer: Medicare Other | Source: Ambulatory Visit | Attending: Family Medicine | Admitting: Family Medicine

## 2012-07-27 DIAGNOSIS — I359 Nonrheumatic aortic valve disorder, unspecified: Secondary | ICD-10-CM

## 2012-07-27 DIAGNOSIS — R55 Syncope and collapse: Secondary | ICD-10-CM | POA: Insufficient documentation

## 2012-07-27 NOTE — Progress Notes (Signed)
  Echocardiogram 2D Echocardiogram has been performed.  Kathryn [name] 07/27/2012, 11:35 AM

## 2012-07-28 ENCOUNTER — Telehealth: Payer: Self-pay | Admitting: Family Medicine

## 2012-07-28 NOTE — Telephone Encounter (Signed)
Discussed echocardiogram results with pt and her dgt. Echocardiogram essentially normal except for some LVH with subsequent diastolic changes.    Given orthostatic changes in SBP, would not start antihypertensive medication.  Dgt is interested in discussing whether patient should be driving.  Pt to be seen in [facility] on 08/17/12 for question of cognitive impairment and falls. Can address capacity for driving also during that consultation.

## 2012-08-03 ENCOUNTER — Telehealth: Payer: Self-pay | Admitting: Family Medicine

## 2012-08-03 ENCOUNTER — Ambulatory Visit (HOSPITAL_COMMUNITY)
Admission: RE | Admit: 2012-08-03 | Discharge: 2012-08-03 | Disposition: A | Discharge status: Home / Self Care | Payer: Medicare Other | Source: Ambulatory Visit | Attending: Family Medicine | Admitting: Family Medicine

## 2012-08-03 ENCOUNTER — Other Ambulatory Visit: Payer: Self-pay | Admitting: Family Medicine

## 2012-08-03 DIAGNOSIS — S5290XA Unspecified fracture of unspecified forearm, initial encounter for closed fracture: Secondary | ICD-10-CM

## 2012-08-03 DIAGNOSIS — M818 Other osteoporosis without current pathological fracture: Secondary | ICD-10-CM

## 2012-08-03 DIAGNOSIS — Z9181 History of falling: Secondary | ICD-10-CM | POA: Insufficient documentation

## 2012-08-03 DIAGNOSIS — Z13820 Encounter for screening for osteoporosis: Secondary | ICD-10-CM | POA: Insufficient documentation

## 2012-08-03 DIAGNOSIS — R296 Repeated falls: Secondary | ICD-10-CM

## 2012-08-03 DIAGNOSIS — Z780 Asymptomatic menopausal state: Secondary | ICD-10-CM | POA: Insufficient documentation

## 2012-08-03 DIAGNOSIS — M810 Age-related osteoporosis without current pathological fracture: Secondary | ICD-10-CM

## 2012-08-03 MED ORDER — ALENDRONATE SODIUM 70 MG PO TABS: 70.0000 mg | ORAL_TABLET | ORAL | Status: DC

## 2012-08-03 NOTE — Telephone Encounter (Signed)
I sdiscussed findings of osteoporosis on DEXA scan with patient and her dgt. Pt is agreeable to starting Alendronate 70 mg per oral weekly and continue Calcium 1000 mg oral supplement daily and Vitamin D 1000 IU daily.

## 2012-08-17 ENCOUNTER — Ambulatory Visit (INDEPENDENT_AMBULATORY_CARE_PROVIDER_SITE_OTHER): Payer: Medicare Other | Admitting: Family Medicine

## 2012-08-17 ENCOUNTER — Encounter: Payer: Self-pay | Admitting: Family Medicine

## 2012-08-17 VITALS — BP 118/82 | HR 104 | Temp 98.0°F | Ht 62.0 in | Wt 126.0 lb

## 2012-08-17 DIAGNOSIS — S52509A Unspecified fracture of the lower end of unspecified radius, initial encounter for closed fracture: Secondary | ICD-10-CM

## 2012-08-17 DIAGNOSIS — I951 Orthostatic hypotension: Secondary | ICD-10-CM | POA: Insufficient documentation

## 2012-08-17 DIAGNOSIS — I10 Essential (primary) hypertension: Secondary | ICD-10-CM

## 2012-08-17 DIAGNOSIS — S52599A Other fractures of lower end of unspecified radius, initial encounter for closed fracture: Secondary | ICD-10-CM

## 2012-08-17 HISTORY — DX: Orthostatic hypotension: I95.1

## 2012-08-17 HISTORY — DX: Unspecified fracture of the lower end of unspecified radius, initial encounter for closed fracture: S52.509A

## 2012-08-17 NOTE — Assessment & Plan Note (Signed)
A: closed, nondisplaced. Excellent ROM. P: Continue to splint F/u with ortho scheduled for tomorrow for repeat x-rays.

## 2012-08-17 NOTE — Progress Notes (Signed)
  Subjective:    Patient ID: Kathryn [name], female    DOB: May 10, 1925, 79 y.o.   MRN: [id number]  HPI    Review of Systems     Objective:   Physical Exam        Assessment & Plan:

## 2012-08-17 NOTE — Assessment & Plan Note (Signed)
A: positive orthostatic vital signs today.  P: -change from lying/sitting to standing slowly -rise with assistance- cane recommended.

## 2012-08-17 NOTE — Progress Notes (Signed)
Subjective:     Patient ID: Kathryn [name], female   DOB: Oct 09, 1924, 79 y.o.   MRN: [id number]  HPI 79 yo F presents to [facility]. Her history and exam were performed alone. 79 daughter was present to discuss the assessment and plan.   1. L wrist distal radial fracture:  -following fall on 07/13/13. Fall was associated with a syncopal episode. No head trauma.  -she is wearing a wrist splint.  -she still has pain but it is on the ulnar side of her wrist.  -she is using her R hand normally.   2. Falls: Two falls in the past 6 weeks.  One fall related to carrying a chair and getting overbalanced. Second fall associated with syncopal episode. She admits to feeling lightheaded upon changing positions rapidly.  She denies room spinning. She drives w/o difficulty. She drives to visit her 33 yo Aunt.   Review of Systems As per HPI     Objective:   Physical Exam BP 118/82  Pulse 104  Temp 98 F (36.7 C) (Oral)  Ht 5\' 2"  (1.575 m)  Wt 126 lb (57.153 kg)  BMI 23.05 kg/m2 Orthostatic vital signs Sitting 152/82 Standing 118/82, HR 105 Standing after 30 seconds: 132/78 General appearance: alert, cooperative, appears stated age and no distress Neck: no carotid bruit Lungs: clear to auscultation bilaterally Heart: regular rate and rhythm, S1, S2 normal, no murmur, click, rub or gallop Back: thoracolumbar kyphosis and scoliosis concave to the L.  MSK: able to perform unassisted sit to stand 8 times. Wearing brace on right wrist. Tender on palpation of ulnar stilloid, but not over distal radius and range of motion of the wrist is normal.  Gait: wide based, gait narrows with increased speed. slow discontinuous turns. Mild path deviation.Able to stand without using arms. complains of mild lightheadedness on first standing.   MMSE: 25 today. See doc flowsheet.      Assessment and Plan:

## 2012-08-17 NOTE — Patient Instructions (Addendum)
Ms. Theys,  Thank you for coming in today.  Please continue to wear your wrist brace and perform your exercises at home.  Your blood pressure does drop significantly when you stand so please do the following: -change from lying/sitting to standing slowly, and pump calves before standing -rise with assistance- cane. Avoid prolonged standing -wear TED stockings when out of bed.   Follow up with Dr. McDiarmid in 2 months.  Dr. Armen Pickup

## 2012-08-19 NOTE — Assessment & Plan Note (Signed)
No longer requiring antihypertensives, due to weight loss?

## 2012-10-11 ENCOUNTER — Encounter: Payer: Self-pay | Admitting: Home Health Services

## 2012-10-11 ENCOUNTER — Telehealth: Payer: Self-pay | Admitting: Family Medicine

## 2012-10-11 DIAGNOSIS — Z9181 History of falling: Secondary | ICD-10-CM | POA: Insufficient documentation

## 2012-10-11 NOTE — Telephone Encounter (Signed)
I spoke with her daughter who requests that her mother be assessed for possible depression. She observes that her mother did better when attending the senior center in [place] regularly.

## 2012-10-11 NOTE — Telephone Encounter (Signed)
Daughter would like to speak to the doctor who is going to see her mom tomorrow to discuss some things about that appt.

## 2012-10-12 ENCOUNTER — Ambulatory Visit (INDEPENDENT_AMBULATORY_CARE_PROVIDER_SITE_OTHER): Payer: Medicare Other | Admitting: Family Medicine

## 2012-10-12 ENCOUNTER — Encounter: Payer: Self-pay | Admitting: Family Medicine

## 2012-10-12 VITALS — BP 122/86 | HR 102 | Ht 62.0 in | Wt 128.0 lb

## 2012-10-12 DIAGNOSIS — I951 Orthostatic hypotension: Secondary | ICD-10-CM

## 2012-10-12 DIAGNOSIS — I10 Essential (primary) hypertension: Secondary | ICD-10-CM

## 2012-10-12 DIAGNOSIS — Z9181 History of falling: Secondary | ICD-10-CM

## 2012-10-12 DIAGNOSIS — M545 Low back pain: Secondary | ICD-10-CM

## 2012-10-12 NOTE — Progress Notes (Signed)
  Subjective:    Patient ID: Kathryn [name], female    DOB: May 02, 1925, 79 y.o.   MRN: [id number]  HPI Falls - None this year and not lightheaded when she stands. She is careful when she first stands up. Is taking her vitamin D and calcium. Does standing up exercises 1 repetitions most days.   Smoking - Occasionally smokes 1-2 cigarettes, but can go weeks without smoking. No cough. Goes to [employer] 3 x weekly to walk around. No cough or shortness of breath   Shingles - had occurred on her back. No residual neuralgia.  Review of Systems     Objective:   Physical Exam  Constitutional: She appears well-developed and well-nourished. No distress.  Cardiovascular: Normal rate and regular rhythm.   Pulmonary/Chest: Effort normal and breath sounds normal.  Musculoskeletal: She exhibits no edema.  Neurological: She is alert.  Gait is steady and narrow based.  Her trunk leans to the right with the left hip higher which is evidently due to her scoliosis when she bends forward.           Assessment & Plan:

## 2012-10-12 NOTE — Assessment & Plan Note (Signed)
I encouraged her to remain active, but to get up carefully. To let us know if she develops orthostatic hypotension in the coming months.

## 2012-10-12 NOTE — Assessment & Plan Note (Signed)
Greater than 20 mm systolic drop without symptoms recently

## 2012-10-12 NOTE — Assessment & Plan Note (Signed)
well controlled  

## 2012-10-12 NOTE — Patient Instructions (Addendum)
You are doing great. Continue your exercising and medications the same. Review the home safety brochure that we gave you.   Be careful not to stand up too quickly because you might get lightheaded some days.  Go ahead and schedule your eye exam.   Return to see Dr McDiarmid in 6-8 weeks.

## 2012-11-23 ENCOUNTER — Ambulatory Visit (INDEPENDENT_AMBULATORY_CARE_PROVIDER_SITE_OTHER): Payer: Medicare Other | Admitting: Family Medicine

## 2012-11-23 ENCOUNTER — Encounter: Payer: Self-pay | Admitting: Family Medicine

## 2012-11-23 VITALS — BP 130/68 | Temp 97.9°F | Ht 62.0 in | Wt 127.0 lb

## 2012-11-23 DIAGNOSIS — R4189 Other symptoms and signs involving cognitive functions and awareness: Secondary | ICD-10-CM

## 2012-11-23 DIAGNOSIS — F09 Unspecified mental disorder due to known physiological condition: Secondary | ICD-10-CM

## 2012-11-23 DIAGNOSIS — M810 Age-related osteoporosis without current pathological fracture: Secondary | ICD-10-CM

## 2012-11-23 DIAGNOSIS — Z9181 History of falling: Secondary | ICD-10-CM

## 2012-11-24 ENCOUNTER — Encounter: Payer: Self-pay | Admitting: Family Medicine

## 2012-11-24 NOTE — Progress Notes (Signed)
  Subjective:    Patient ID: Kathryn [name], female    DOB: Jul 27, 1924, 79 y.o.   MRN: [id number]  HPI  Falls: No recurrence of falls. Carefull with standing. Performing balance and strengthening exercises daily. Living with dgt and son-in-law who are now retired. Driving(+): no MVAs.  (+) tobacco dependence.  Not interested in quitting at this time  Osteoporosis Taking alendronate 70 gm once weekly by directions.  Sitting up > 30 minutes.  Taking on empty stomach No indigestion, dysphagia.  No odynophagia.   Medication list confirmed with patient.  Review of Systems  See HPI. No fever. No headache.     Objective:   Physical Exam  Constitutional: No distress.  HENT:  Right Ear: Tympanic membrane and external ear normal.  Left Ear: Tympanic membrane and external ear normal.  Cardiovascular: Normal rate and regular rhythm.  PMI is not displaced.   No murmur heard.  No systolic murmur is present  Pulmonary/Chest: Effort normal and breath sounds normal.  Neurological: She has normal strength. She exhibits normal muscle tone. She displays a negative Romberg sign.  Able to stand from chair without assistance nor imbalance.   Ambulation with full steps, no drift about midline trajectory.   Psychiatric: She has a normal mood and affect. Her speech is normal and behavior is normal. Thought content normal. She exhibits normal remote memory.          Assessment & Plan:

## 2013-03-08 ENCOUNTER — Ambulatory Visit (INDEPENDENT_AMBULATORY_CARE_PROVIDER_SITE_OTHER): Payer: Medicare Other | Admitting: Family Medicine

## 2013-03-08 ENCOUNTER — Encounter: Payer: Self-pay | Admitting: Family Medicine

## 2013-03-08 VITALS — BP 145/88 | HR 101 | Temp 98.2°F | Wt 126.0 lb

## 2013-03-08 DIAGNOSIS — R413 Other amnesia: Secondary | ICD-10-CM

## 2013-03-08 DIAGNOSIS — I10 Essential (primary) hypertension: Secondary | ICD-10-CM

## 2013-03-08 DIAGNOSIS — M810 Age-related osteoporosis without current pathological fracture: Secondary | ICD-10-CM

## 2013-03-08 DIAGNOSIS — G3184 Mild cognitive impairment, so stated: Secondary | ICD-10-CM

## 2013-03-08 NOTE — Patient Instructions (Signed)
You do have a little decline in your memory but you are still able to do all your daily tasks, so I do not think we need to do anything different for now.  If you start getting lost, or start being unable to do your daily task, we will need to talk.

## 2013-03-09 ENCOUNTER — Encounter: Payer: Self-pay | Admitting: Family Medicine

## 2013-03-09 DIAGNOSIS — G3184 Mild cognitive impairment, so stated: Secondary | ICD-10-CM

## 2013-03-09 HISTORY — DX: Mild cognitive impairment of uncertain or unknown etiology: G31.84

## 2013-03-09 NOTE — Assessment & Plan Note (Signed)
Adequate blood pressure control.  No evidence of new end organ damage.

## 2013-03-09 NOTE — Assessment & Plan Note (Signed)
Remains independent in iADLs.  Copes with memory difficulties well.  No falls.

## 2013-03-09 NOTE — Progress Notes (Signed)
Patient ID: Kathryn [name], female   DOB: [birth year], 79 y.o.   MRN: [id number]  Subjective:    Patient ID: Kathryn [name], female    DOB: 1924-11-13, 79 y.o.   MRN: [id number]  HPI  Falls: No recurrence of falls. Carefull with standing. Performing balance and strengthening exercises daily. Living with dgt and son-in-law who are now retired. Driving(+): no MVAs.  (+) tobacco dependence.  Not interested in quitting at this time Independent in iADLs including transportation, finances, housework.   Osteoporosis Taking alendronate 70 gm once weekly by directions.  Sitting up > 30 minutes.  Taking on empty stomach No indigestion, dysphagia.  No odynophagia.   Medication list confirmed with patient.  Review of Systems  See HPI. No fever. No headache.     Objective:   Physical Exam  Constitutional: No distress.  HENT:  Right Ear: Tympanic membrane and external ear normal.  Left Ear: Tympanic membrane and external ear normal.  Cardiovascular: Normal rate and regular rhythm.  PMI is not displaced.   No murmur heard.  No systolic murmur is present  Pulmonary/Chest: Effort normal and breath sounds normal.  Neurological: She has normal strength. She exhibits normal muscle tone. She displays a negative Romberg sign.  Able to stand from chair without assistance nor imbalance.   Ambulation with full steps, no drift about midline trajectory.   Psychiatric: She has a normal mood and affect. Her speech is normal and behavior is normal. Thought content normal. She exhibits normal remote memory.   PHQ-2: 0/2 MiniCog: FAILED (1 out of 3 object recall with normal clock draw)         Assessment & Plan:

## 2013-03-09 NOTE — Assessment & Plan Note (Signed)
Tolerating the alendronate weekly and the daily vitamin D and calcium oral supplement.

## 2013-05-24 ENCOUNTER — Other Ambulatory Visit: Payer: Self-pay

## 2013-06-21 ENCOUNTER — Ambulatory Visit (INDEPENDENT_AMBULATORY_CARE_PROVIDER_SITE_OTHER): Payer: Medicare Other | Admitting: Family Medicine

## 2013-06-21 VITALS — BP 168/105 | HR 121 | Temp 98.0°F | Wt 129.0 lb

## 2013-06-21 DIAGNOSIS — R4189 Other symptoms and signs involving cognitive functions and awareness: Secondary | ICD-10-CM

## 2013-06-21 DIAGNOSIS — F17200 Nicotine dependence, unspecified, uncomplicated: Secondary | ICD-10-CM

## 2013-06-21 DIAGNOSIS — Z9181 History of falling: Secondary | ICD-10-CM

## 2013-06-21 DIAGNOSIS — F09 Unspecified mental disorder due to known physiological condition: Secondary | ICD-10-CM

## 2013-06-21 DIAGNOSIS — I10 Essential (primary) hypertension: Secondary | ICD-10-CM

## 2013-06-21 DIAGNOSIS — M1710 Unilateral primary osteoarthritis, unspecified knee: Secondary | ICD-10-CM

## 2013-06-21 DIAGNOSIS — M545 Low back pain: Secondary | ICD-10-CM

## 2013-06-21 DIAGNOSIS — M810 Age-related osteoporosis without current pathological fracture: Secondary | ICD-10-CM

## 2013-06-21 NOTE — Patient Instructions (Signed)
Your are doing well.  No changes in your medications are recommended.  Use your cane!

## 2013-06-22 ENCOUNTER — Encounter: Payer: Self-pay | Admitting: Family Medicine

## 2013-06-22 NOTE — Progress Notes (Signed)
Subjective:    Patient ID: Kathryn [name], female    DOB: 10/31/1924, 79 y.o.   MRN: [id number] Pt is unaccompanied for visit Pt and EMR are source of information for visit.  HPI   Medication List       This list is accurate as of: 06/21/13 11:59 PM.  Always use your most recent med list.               acetaminophen 500 MG tablet  Commonly known as:  TYLENOL  Take 500 mg by mouth every 6 (six) hours as needed.     alendronate 70 MG tablet  Commonly known as:  FOSAMAX  Take 1 tablet (70 mg total) by mouth every 7 (seven) days. Take with a full glass of water on an empty stomach.     CALCIUM + D 315-200 MG-UNIT per tablet  Generic drug:  calcium citrate-vitamin D     CENTRUM SILVER ULTRA WOMENS Tabs  Take 1 tablet by mouth daily.     cholecalciferol 400 UNITS Tabs tablet  Commonly known as:  VITAMIN D  Take 400 Units by mouth.     vitamin C 500 MG tablet  Commonly known as:  ASCORBIC ACID       Problem List Items Addressed This Visit     Cardiovascular and Mediastinum   HYPERTENSION, BENIGN ESSENTIAL (Chronic) CHRONIC HYPERTENSION  Disease Monitoring  Blood pressure range: in 140 to 150 range systolic at home  Chest pain: no   Dyspnea: no   Claudication: no   Medication compliance: not taking medication        Musculoskeletal and Integument   Osteoporosis, unspecified (Chronic)  OSTEOPOROSIS risk factors (non-modifiable) Personal Hx of fracture as an adult: yes Hx of fracture in first-degree relative: unknown Caucasian race: yes Advanced age: yes female sex: yes Dementia: cognitive impairment Poor health/frailty:no   OSTEOPOROSIS risk factors (potentially modifiable): Tobacco use: yes Low body weight (<127 lbs): yes Low calcium intake (lifelong): yes Alcoholism: not applicable  Recurrent falls: yes Inadequate physical activity: yes  Current calcium and Vit D intake: Dietary sources: < 7 servings a week supplements: yes   Prescription  Therapies Compliance Bisphosphonates: Fosamax 70 mg weekly SERMs:  HRT:  Denosumab:  Teriparatide:   Adverse effects of medications: Dysphagia: no Odynophagia: no Abdominal pain: no Jaw pain: no    OSTEOARTHRITIS, LOWER LEG (Chronic) - Longstanding osteoarthritis of the bilateral knees,and of lumbar spine with and without leg contracture and with scoliosis, doing well overall - Pain is on the bilateral  knees, hips, lumbar spine. - Quality: There is no swelling, no redness, or no increased warmth. The pain is described as aching. occasionally. There is no burning.  - Pattern: intermittent flares - Duration: years Associated symptoms: Includes stiffness and occasional weakness. There is no sleep loss and no instability.  Hip Pain: no  Back pain: yes.  Radicular type pain: no Modifying factors: Includes weight bearing pain and pain with ambulation. There is no sitting, and no night pain. There is pain with weather change. APAP as needed  medication doeshelps.  Assistive devices: uses one point cane - uses outside mostly.  Did not use to come into office today.      Other   TOBACCO DEPENDENCE - Primary - Longstanding problem for patient - Curently smoking two cigarettes per day - not interested in smoking cessation at this time.  Does not see her smoking as signficantly deliterious to her health.    Mild  Cognitive impairment - Diagnosed in January 2014 with MMSE in January 2014. - Remains independent in her ADLs and iaDLs. Pt continues to drive without accident or getting lost and continues to manage her own fiances/retirement monies independently per pt.  - Living with her dgt. Maintains one private room in her dgt's home.    BACK PAIN, LUMBAR, CHRONIC - Longstanding osteoarthritis of the bilateral knees, of hips, of lumbar spine with and without contracture, doing well  -   - Pain is on the low back  - Quality: There is no swelling, no redness, or no increased warmth. The  pain is described as aaching. occasionally. There is no burning.  - Pattern: intermittent - Duration: years Associated symptoms: Includes stiffness and weakness. There is no sleep loss and no instability.  Hip Pain: no Knee pain: bilateral, see above.  Radicular type pain: no Modifying factors: Includes weight bearing pain and pain with ambulation. There is no sitting, and no night pain. There is no pain with weather change. APAP as needed medication does helps.  Assistive devices: uses a cane    At high risk for falls - longstanding issue - No falls in last 3 months. - Using cane at home - performing HEP daily - denies dizziness or vertigo with standing.          Review of Systems     Objective:   Physical Exam        Assessment & Plan:

## 2013-06-22 NOTE — Assessment & Plan Note (Signed)
Adequate blood pressure control with lifestyle management.  

## 2013-06-22 NOTE — Assessment & Plan Note (Addendum)
MMSE is 21 out of 25 (decreased from 25/30 in January 2014). Relatively rapid decrease in score that may be due to interrater variation or due to progression of her neurodegenerative process.  With preservation of her current preservation of her iADLs currently does not meet criteria for dementia.   Will continue to monitor function and cognition. Good support system.

## 2013-06-22 NOTE — Assessment & Plan Note (Signed)
No recent falls. Continues HEP daily Using one-point cane when ambulating outside of home only.

## 2013-06-22 NOTE — Assessment & Plan Note (Addendum)
Adequate symptom control. Tolerating APAP as needed analgesic medication. Continue current medication regiment.

## 2013-06-22 NOTE — Assessment & Plan Note (Signed)
Smoking cessation instruction/counseling given:  counseled patient on the dangers of tobacco use, advised patient to stop smoking, and reviewed strategies to maximize success 

## 2013-06-22 NOTE — Assessment & Plan Note (Signed)
Is pharmacological therapy indicated? yes Which medication is selected: alendronate (FOSAMAX)  Which dosing regimen is selected: once weekly   A. Contraindications to bisphosphonates: hypocalcemia, esophageal abnormalities, inability to remain upright for at least 30 min. post dose, renal insufficiency. Contraindications reviewed: yes.  B. Patients on bisphosphonates should also receive adequate calcium and vitamin D supplementation. Patient advised regarding calcium and vitamin D supplements: yes.   Adequate symptom control. Tolerating fosamax weekly medication. Continue current medication regiment along with calcium supplement and vit d supplement. Marland Kitchen

## 2013-11-01 ENCOUNTER — Encounter: Payer: Self-pay | Admitting: Family Medicine

## 2013-11-01 ENCOUNTER — Ambulatory Visit (INDEPENDENT_AMBULATORY_CARE_PROVIDER_SITE_OTHER): Payer: Medicare Other | Admitting: Family Medicine

## 2013-11-01 VITALS — BP 149/76 | HR 90 | Temp 97.8°F | Resp 16 | Ht 62.0 in | Wt 126.0 lb

## 2013-11-01 DIAGNOSIS — Z23 Encounter for immunization: Secondary | ICD-10-CM

## 2013-11-01 DIAGNOSIS — R4189 Other symptoms and signs involving cognitive functions and awareness: Secondary | ICD-10-CM

## 2013-11-01 DIAGNOSIS — I10 Essential (primary) hypertension: Secondary | ICD-10-CM

## 2013-11-01 DIAGNOSIS — F09 Unspecified mental disorder due to known physiological condition: Secondary | ICD-10-CM

## 2013-11-01 DIAGNOSIS — F17200 Nicotine dependence, unspecified, uncomplicated: Secondary | ICD-10-CM

## 2013-11-01 DIAGNOSIS — M810 Age-related osteoporosis without current pathological fracture: Secondary | ICD-10-CM

## 2013-11-01 MED ORDER — ALENDRONATE SODIUM 70 MG PO TABS: 70.0000 mg | ORAL_TABLET | ORAL | Status: DC

## 2013-11-01 NOTE — Assessment & Plan Note (Signed)
Adequate blood pressure control with lifestyle management.

## 2013-11-01 NOTE — Assessment & Plan Note (Signed)
Patient had stopped her alendronate without clear reason.  Not stopped for reason of intolerance.  Asked patient to resume her weekly alendronate.  Continue calcium and vitamin D supplements.

## 2013-11-01 NOTE — Assessment & Plan Note (Signed)
Stable. MMSE 23 out of 30.  Remains independent in iADLs.  No evidence of depression.  Monitor.

## 2013-11-01 NOTE — Patient Instructions (Signed)
Your Blood pressure is Good Restart taking your Alendronate (Fosamax) pill once a week to keep your bones strong and avoid fracturing bones.   Keep taking your daily home exercises, they will help keep you from falling.  You got a booster Pneumonia Vaccination today called Prevnar-13.

## 2013-11-01 NOTE — Progress Notes (Signed)
Patient ID: Kathryn [name], female   DOB: Dec 12, 1924, 80 y.o.   MRN: [id number]   Subjective:    Patient ID: Kathryn [name], female    DOB: 10-11-1924, 80 y.o.   MRN: [id number] Pt is unaccompanied for visit Pt and EMR are source of information for visit.  HPI   Medication List       This list is accurate as of: 11/01/13  3:19 PM.  Always use your most recent med list.               CALCIUM + D 315-200 MG-UNIT per tablet  Generic drug:  calcium citrate-vitamin D     CENTRUM SILVER ULTRA WOMENS Tabs  Take 1 tablet by mouth daily.     cholecalciferol 400 UNITS Tabs tablet  Commonly known as:  VITAMIN D  Take 400 Units by mouth.     vitamin C 500 MG tablet  Commonly known as:  ASCORBIC ACID       Problem List Items Addressed This Visit     Cardiovascular and Mediastinum   HYPERTENSION, BENIGN ESSENTIAL (Chronic) CHRONIC HYPERTENSION  Disease Monitoring  Blood pressure range: in 140 to 009 range systolic at home  Chest pain: no   Dyspnea: no   Claudication: no   Medication compliance: not taking medication        Musculoskeletal and Integument   Osteoporosis, unspecified (Chronic)  OSTEOPOROSIS risk factors (non-modifiable) Personal Hx of fracture as an adult: yes Hx of fracture in first-degree relative: unknown Caucasian race: yes Advanced age: yes female sex: yes Dementia: cognitive impairment Poor health/frailty:no   OSTEOPOROSIS risk factors (potentially modifiable): Tobacco use: yes Low body weight (<127 lbs): yes Low calcium intake (lifelong): yes Alcoholism: not applicable  Recurrent falls: yes Inadequate physical activity: yes  Current calcium and Vit D intake: Dietary sources: < 7 servings a week supplements: yes   Prescription Therapies Compliance Bisphosphonates: Patient is not taking the prescribed Fosamax 70 mg weekly. She thought it had been stopped.  Adverse effects of medications: Dysphagia: no Odynophagia: no Abdominal pain: no Jaw  pain: no    OSTEOARTHRITIS, LOWER LEG (Chronic) - Longstanding osteoarthritis of the bilateral knees,and of lumbar spine with and without leg contracture and with scoliosis, doing well overall - Patient is doing her daily home exercise program to strengthen her legs - Pain is on the bilateral  knees, hips, lumbar spine. - Quality: There is no swelling, no redness, or no increased warmth. The pain is described as aching. occasionally. There is no burning.  - Pattern: intermittent flares - Duration: years Associated symptoms: Includes stiffness and occasional weakness. There is no sleep loss and no instability.  Hip Pain: no  Back pain: yes.  Radicular type pain: no Modifying factors: Includes weight bearing pain and pain with ambulation. There is no sitting, and no night pain. There is pain with weather change. APAP as needed  medication does helps.  Assistive devices: uses one point cane - uses outside mostly.  Did not use to come into office today.      Other   TOBACCO DEPENDENCE - Primary - Longstanding problem for patient - Curently smoking two cigarettes per day - not interested in smoking cessation at this time.     Mild Cognitive impairment - Diagnosed in January 2014 with MMSE in January 2014. MMSE today of 23 out of 30 (prior scores of 21 out of 17 July 2013,  25 out of 17 August 2012) - Remains independent in  her ADLs and iaDLs. Pt continues to drive without accident or getting lost and continues to manage her own fiances/retirement monies independently per pt.  - Living with her dgt. Maintains one private room in her dgt's home.    At high risk for falls - longstanding issue - No falls in last 6 months. - Using cane at home - performing HEP daily - denies dizziness or vertigo with standing.          Review of Systems  Constitutional: Negative for activity change, appetite change and unexpected weight change.  HENT: Negative for hearing loss.   Eyes: Negative  for visual disturbance.  Musculoskeletal: Negative for joint swelling.  Neurological: Negative for dizziness and syncope.       Objective:   Physical Exam  Vitals reviewed. Constitutional: No distress.  HENT:  Right Ear: Tympanic membrane and ear canal normal. No decreased hearing is noted.  Left Ear: Tympanic membrane normal. No decreased hearing is noted.  Neck: No hepatojugular reflux present. Carotid bruit is not present. No mass and no thyromegaly present.  Cardiovascular: Normal rate, regular rhythm and normal heart sounds.  Exam reveals no gallop.   No murmur heard. Pulmonary/Chest: Effort normal and breath sounds normal.  Neurological: She is alert.  Gait with balance.  No need for assistance.  Using one point cane.   Psychiatric: She has a normal mood and affect. Her speech is normal and behavior is normal.          Assessment & Plan:

## 2014-03-04 ENCOUNTER — Telehealth: Payer: Self-pay | Admitting: Family Medicine

## 2014-03-04 NOTE — Telephone Encounter (Signed)
Pt is aware of appt for 03-07-14 @ 10:30am. Anival Pasha,CMA

## 2014-03-04 NOTE — Telephone Encounter (Signed)
Pt came by the office to make an appt. She says she is going crazy.  She is forgetting things more frequently.  She is very concerned.  There were no appts available. Pt would like to talk to him

## 2014-03-04 NOTE — Telephone Encounter (Signed)
CC:  Right midback pain  Present for months with recent subacute worsening while caring for her ill aunt. No acute trauma recalled. Treating with heating pad and APAP 1000 mg twice daily prn No fever. No N/V.  No wekaness in limbs.  Able to perform iadl and adls.  A/ Acute exacerbation of chronic back pain P/ Continue heat pad      Increase APAP to 1000 mg THREE TIMES DAILY scheduled until see Dr Casten Floren on 03/07/14 clinic in morning.

## 2014-03-07 ENCOUNTER — Encounter: Payer: Self-pay | Admitting: Family Medicine

## 2014-03-07 ENCOUNTER — Ambulatory Visit (INDEPENDENT_AMBULATORY_CARE_PROVIDER_SITE_OTHER): Payer: Medicare Other | Admitting: Family Medicine

## 2014-03-07 VITALS — BP 142/84 | HR 98 | Temp 98.4°F | Ht 62.0 in | Wt 124.0 lb

## 2014-03-07 DIAGNOSIS — R109 Unspecified abdominal pain: Secondary | ICD-10-CM

## 2014-03-07 DIAGNOSIS — M549 Dorsalgia, unspecified: Secondary | ICD-10-CM

## 2014-03-07 NOTE — Assessment & Plan Note (Addendum)
New complaint. Acute on chronic back pain Localized to right lower thorax-lumbar region  Recommend start of APAP scheduled 1000 mg three times daily  Patient going to return with driving assistance to go for Xray to look for thoracic or lumbar vetebral fracture or rib fracture.  Reviewed Red flags requiring RTC, including worsening pain or onset of shortness of breath.

## 2014-03-07 NOTE — Progress Notes (Signed)
   Subjective:    Patient ID: Kathryn [name], female    DOB: [birth year], 80 y.o.   MRN: [id number]  HPI BACK PAIN Onset: over last two months Location: right lateral mid to low back Quality: aching Severity: moderate Function: not impairing iADL or ADLs Duration: intermittent over last two months Pattern: intermittent Course: stable.   Radiation: moves around right side but not into right abdomen. Relief: topical heat pads and APAP Precipitant: Worse after long day of working Associated Symptoms: no fever, no dysuria, no urinay frequency, No chills.  No N/V/d.  Trauma (Acute or Chronic): none recalled.  Prior Diagnostic Testing or Treatments: known scoliosis.  Relevant PMH/PSH: no prior surgeries on back  (+) osteoporosis   (+) smoking.   Review of Systems     Objective:   Physical Exam Back Exam: Inspection: Scoliosis right convex Motion: no limitation with flexion.  SLR seated:  Negative bilaterally                        SLR lying: XSLR seated:                        XSLR lying:  Palpable tenderness: right floating ribs.  No overlying bruising.  No tenderness to percussion of the thoracic and lumbar processes.   Reflex change: DTR knee bil 0 Ankle bil 0 Babinski; downgoing toes bilateral  Strength at foot Plantar-flexion: 5 / 5    Dorsi-flexion: 5 / 5    Eversion: 5 / 5   Inversion: 5 / 5 Leg strength Quad:  5/ 5   Hamstring:  5/ 5   Hip flexor: 5 / 5   Hip abductors:  5/ 5 Gait: kyphotic stance but full swing phase bilateral, no swing beyond midline.  No short stance phase.   Abdomin: soft, NT, ND, (+) BS Cor: RRR, No M/G.   Lung: BCTA, no acc mm use.        Assessment & Plan:

## 2014-03-07 NOTE — Patient Instructions (Addendum)
Keep taking the Tylenol three times a day until pain has been gone for three days in a row.   Keep using the heating pads as needed.   If you get sick, or pain worsens, let Dr Wendy Poet know.   Go to the [facility].  It is a medical center building with doctor's offices.  It has an X-ray department in it. You do not need an appointment.  They will already know which X-rays to get on you.

## 2014-03-11 ENCOUNTER — Other Ambulatory Visit: Payer: Self-pay | Admitting: Family Medicine

## 2014-03-11 ENCOUNTER — Ambulatory Visit
Admission: RE | Admit: 2014-03-11 | Discharge: 2014-03-11 | Disposition: A | Discharge status: Home / Self Care | Payer: Medicare Other | Source: Ambulatory Visit | Attending: Family Medicine | Admitting: Family Medicine

## 2014-03-11 DIAGNOSIS — R109 Unspecified abdominal pain: Secondary | ICD-10-CM

## 2014-03-12 ENCOUNTER — Telehealth: Payer: Self-pay | Admitting: Family Medicine

## 2014-03-12 NOTE — Telephone Encounter (Signed)
Informed Kathryn [name] that there was no evidence of fracture.  She reports her back is doing better with the scheduled Tyloenol and topical chemical heating pad.    Will ask nursing to schedule patient to see me again in late September.

## 2014-03-13 ENCOUNTER — Telehealth: Payer: Self-pay | Admitting: *Deleted

## 2014-03-13 NOTE — Telephone Encounter (Signed)
Pt is aware of appt for 04-11-14 @ 9am.  Reminder card mailed to her. Jackye Dever,CMA \

## 2014-03-13 NOTE — Telephone Encounter (Signed)
Message copied by Valerie Roys on Wed Mar 13, 2014  9:33 AM ------      Message from: [facility], TODD D      Created: Tue Mar 12, 2014  3:27 PM       Please contact Ms Mezquita to arrange an appointment with me  sometime in late September to mid-October.            It is OK to double-book me.             Thank you,      Sherren Mocha ------

## 2014-04-11 ENCOUNTER — Encounter: Payer: Self-pay | Admitting: Family Medicine

## 2014-04-11 ENCOUNTER — Ambulatory Visit (INDEPENDENT_AMBULATORY_CARE_PROVIDER_SITE_OTHER): Payer: Medicare Other | Admitting: Family Medicine

## 2014-04-11 VITALS — BP 132/82 | HR 80 | Ht 62.0 in | Wt 120.0 lb

## 2014-04-11 DIAGNOSIS — F17200 Nicotine dependence, unspecified, uncomplicated: Secondary | ICD-10-CM

## 2014-04-11 DIAGNOSIS — M545 Low back pain, unspecified: Secondary | ICD-10-CM

## 2014-04-11 DIAGNOSIS — I10 Essential (primary) hypertension: Secondary | ICD-10-CM

## 2014-04-11 DIAGNOSIS — F09 Unspecified mental disorder due to known physiological condition: Secondary | ICD-10-CM

## 2014-04-11 DIAGNOSIS — R4189 Other symptoms and signs involving cognitive functions and awareness: Secondary | ICD-10-CM

## 2014-04-11 DIAGNOSIS — Z23 Encounter for immunization: Secondary | ICD-10-CM

## 2014-04-11 NOTE — Assessment & Plan Note (Signed)
Stable. Remaining independent in iADLs.  Discussed role of aerobic and strengthening exercise in preventing decline in cognitive functioning.

## 2014-04-11 NOTE — Assessment & Plan Note (Signed)
.  Adequate control with lifestyle interventions.  No evidence of new end organ damage. Continue current lifestyle treatment.

## 2014-04-11 NOTE — Assessment & Plan Note (Signed)
Improved symptom control Continue schedule APAP. Recommended Thera-band resistance training for Upper extremity strengthening.  Demonstrated exercise.  Thera-Band given to patient.

## 2014-04-11 NOTE — Progress Notes (Signed)
Subjective:    Patient ID: Kathryn [name], female    DOB: 1925-04-05, 80 y.o.   MRN: [id number]  HPI  Problem List Items Addressed This Visit     Medium   TOBACCO DEPENDENCE - Longstanding issue - Smoking 2 to 4 cigarettes a day - No cough, no sputum, no shortness of breath - Pt not intereseted in quitting.    Mild Cognitive impairment - MMSE 23 out of 30 (10/2013) - iADLs: (+) Shopping, (+) Housework, (+) self administering medications, (+) Food prep, (+) telephone, (+) driving.  - No falls.     HYPERTENSION, BENIGN ESSENTIAL (Chronic) Disease Monitoring  Blood pressure range: when checks at home BP < 150/90  Chest pain: no   Dyspnea: no   Claudication: no   Preventitive Healthcare:  Exercise: daily walking, strength and balance.  Diet Pattern: has not had aas good an appetitie since death of her aunt two months ago.   Salt Restriction: no, regular diet.        Unprioritized   BACK PAIN, LUMBAR, CHRONIC - Longstanding - improved since last office visit. Only occasional, short duration pains in back now.  - Know significant progressive thoracolumbar dextroscoliosis with  Progression of DJD. - Taking scheduled APAP with hot packs improvement in back pain.  -  Pt Remaining active.  - No fever, no night sweats, no leg pain, no weakness in legs.  No change in bowel or bladder habits.     Other Visit Diagnoses   Need for prophylactic vaccination and inoculation against influenza    -  Primary     Weight Loss - Onset about two months ago after death of her aunt with whom see was very close. - Decline in appetite since aunt's death.  - Is not taking nutrient shake supplements.  Eating cereal in morning, sandwich at lunch and meal prepared by dgt in evening. - Denies cough, hemoptysis, night sweats, fever, abdominal pain, melena/hematochezia, hematuria, vaginal bleeding or pelvic heaviness sensation.  - While occasional passing feelings of sadness thinking about her aunt,  she continues to enjoy activities.  No change in her sleeping. No thoughts of guilt, worthlessness.  No new difficulty concentrating.      Review of Systems     Objective:   Physical Exam VS reviewed GEN: Alert, Cooperative, Groomed, NAD HEENT: PERRL; EAC bilaterally not occluded, TM's translucent with normal LM, (+) LR;                No cervical LAN, No thyromegaly, No palpable masses, (+) HOH COR: RRR, No M/G/R, No JVD, Normal PMI size and location LUNGS: BCTA, No Acc mm use, speaking in full sentences ABDOMEN: (+)BS, soft, NT, ND, No HSM, No palpable masses EXT: No peripheral leg edema. Feet without deformity or lesions. Palpable bilateral pedal pulses.  SKIN: No lesion nor rashes of face/trunk/extremities DTR: Bilateral Bicep 2+, Bilateral Triceps 2+, Bilateral Knees 2+, Bilateral Ankles 1+ Gait: Rose from chair without assistance and without using arms.  Kyphoscoliotic back, fair gait speed, No significant path deviation, Step through + Psych: Normal affect/thought/speech/language    Assessment & Plan:  HYPERTENSION, BENIGN ESSENTIAL .Adequate control with lifestyle interventions.  No evidence of new end organ damage. Continue current lifestyle treatment.   Mild Cognitive impairment Stable. Remaining independent in iADLs.  Discussed role of aerobic and strengthening exercise in preventing decline in cognitive functioning.   TOBACCO DEPENDENCE Smoking cessation instruction/counseling given: counseled patient on the dangers of tobacco use, advised patient  to stop smoking, and reviewed strategies to maximize success    BACK PAIN, LUMBAR, CHRONIC Improved symptom control Continue schedule APAP. Recommended Thera-band resistance training for Upper extremity strengthening.  Demonstrated exercise.  Thera-Band given to patient.

## 2014-04-11 NOTE — Assessment & Plan Note (Signed)
Smoking cessation instruction/counseling given:  counseled patient on the dangers of tobacco use, advised patient to stop smoking, and reviewed strategies to maximize success 

## 2014-04-11 NOTE — Patient Instructions (Signed)
Work on exercising your arms at night using the resistance band.

## 2014-06-06 ENCOUNTER — Telehealth: Payer: Self-pay | Admitting: Family Medicine

## 2014-06-06 ENCOUNTER — Ambulatory Visit (INDEPENDENT_AMBULATORY_CARE_PROVIDER_SITE_OTHER): Payer: Medicare Other | Admitting: Family Medicine

## 2014-06-06 ENCOUNTER — Encounter: Payer: Self-pay | Admitting: Family Medicine

## 2014-06-06 VITALS — BP 126/81 | HR 101 | Temp 97.8°F | Ht 62.0 in | Wt 122.6 lb

## 2014-06-06 DIAGNOSIS — Z720 Tobacco use: Secondary | ICD-10-CM

## 2014-06-06 DIAGNOSIS — I10 Essential (primary) hypertension: Secondary | ICD-10-CM

## 2014-06-06 DIAGNOSIS — M810 Age-related osteoporosis without current pathological fracture: Secondary | ICD-10-CM

## 2014-06-06 DIAGNOSIS — F17210 Nicotine dependence, cigarettes, uncomplicated: Secondary | ICD-10-CM

## 2014-06-06 DIAGNOSIS — R4189 Other symptoms and signs involving cognitive functions and awareness: Secondary | ICD-10-CM

## 2014-06-06 DIAGNOSIS — R296 Repeated falls: Secondary | ICD-10-CM | POA: Diagnosis not present

## 2014-06-06 DIAGNOSIS — F17200 Nicotine dependence, unspecified, uncomplicated: Secondary | ICD-10-CM

## 2014-06-06 DIAGNOSIS — Z9181 History of falling: Secondary | ICD-10-CM

## 2014-06-06 MED ORDER — IBANDRONATE SODIUM 150 MG PO TABS: 150.0000 mg | ORAL_TABLET | ORAL | Status: DC

## 2014-06-06 NOTE — Telephone Encounter (Signed)
Pt called and needs her prescription for Boniva sent to the [facility] in [place]. jw

## 2014-06-06 NOTE — Assessment & Plan Note (Signed)
Smoking cessation instruction/counseling given: counseled patient on the dangers of tobacco use, advised patient to stop smoking, and reviewed strategies to maximize success. Counseling for 3 minutes

## 2014-06-06 NOTE — Progress Notes (Signed)
Patient ID: Kathryn [name], female   DOB: Aug 27, 1924, 80 y.o.   MRN: [id number]   Subjective:    Patient ID: Kathryn [name], female    DOB: [birth year], 80 y.o.   MRN: [id number]  HPI  Problem List Items Addressed This Visit     Medium   TOBACCO DEPENDENCE - Longstanding issue - Smoking 2 to 4 cigarettes a day - No cough, no sputum, no shortness of breath - Pt not intereseted in quitting.    Mild Cognitive impairment - MMSE 23 out of 30 (10/2013) - iADLs: (+) Shopping, (+) Housework, (+) self administering medications, (+) Food prep, (+) telephone, (+) driving.  - No falls.     HYPERTENSION, BENIGN ESSENTIAL (Chronic) Disease Monitoring  Blood pressure range: not checking at home  Chest pain: no   Dyspnea: no   Claudication: no   Preventitive Healthcare:  Exercise: daily walking, strength and balance.  Diet Pattern: has not had aas good an appetitie since death of her aunt two months ago.   Salt Restriction: no, regular diet.        Unprioritized   BACK PAIN, LUMBAR, CHRONIC - Longstanding - improved since last office visit. Only occasional, short duration pains in back now.  - Know significant progressive thoracolumbar dextroscoliosis with  Progression of DJD. - Taking scheduled APAP with hot packs improvement in back pain.  -  Pt Remaining active.  She is using Theraband exercises daily.  -  Weight Loss - Weight stabilized. She believes much of the weight loss was her loss of appetite after the death of her best friend, her aunt.  - Is not taking nutrient shake supplements.  Eating cereal in morning, sandwich at lunch and meal prepared by dgt in evening. - Patient continues to denie cough, hemoptysis, night sweats, fever, abdominal pain, melena/hematochezia, hematuria, vaginal bleeding or pelvic heaviness sensation.  - PHQ-2 negative.   (+) smoking   Review of Systems     Objective:   Physical Exam VS reviewed GEN: Alert, Cooperative, Groomed, NAD HEENT: PERRL;  EAC bilaterally not occluded, TM's translucent with normal LM, (+) LR;                No cervical LAN, No thyromegaly, No palpable masses, (+) HOH COR: RRR, No M/G/R, No JVD, Normal PMI size and location LUNGS: BCTA, No Acc mm use, speaking in full sentences ABDOMEN: (+)BS, soft, NT, ND, No HSM, No palpable masses EXT: No peripheral leg edema. Feet without deformity or lesions. Palpable bilateral pedal pulses.  SKIN: No lesion nor rashes of face/trunk/extremities DTR: Bilateral Bicep 2+, Bilateral Triceps 2+, Bilateral Knees 2+, Bilateral Ankles 1+ Gait: Rose from chair without assistance and without using arms.  Kyphoscoliotic back, fair gait speed, No significant path deviation, Step through + Psych: Normal affect/thought/speech/language    Assessment & Plan:  TOBACCO DEPENDENCE Smoking cessation instruction/counseling given: counseled patient on the dangers of tobacco use, advised patient to stop smoking, and reviewed strategies to maximize success. Counseling for 3 minutes    Mild Cognitive impairment Chronic issue Pt remains independent in her iADLs.  No evidence of significant progression. Continue to monitor function at each visit.    HYPERTENSION, BENIGN ESSENTIAL Chronic issue Stable. No clinical evidence of end organ damage.  While seated pressures are elevated, standing pressures are less than 140/90.  Patient has history of near-syncope and fatigue with antihypertensive therapy in the past. Will continue to monitor.  No plans to initiate antihypertensive pharmacotherapy.   Osteoporosis  Chronic Issue Patient has stopped taking her alendronate because she says it makes her not feel well for several days after her weekly dose.  She denies GI upset with medication. She has continued the Theraband exercises daily.   Recommend trial on Boniva 150 mg once monthly. Continue calcium and vitamin d supplementation daily.    At high risk for falls No new falls. Exercising.   Recent eye exam without evidence of decline in vision per patient report. No glaucoma. No mac degeneration.

## 2014-06-06 NOTE — Assessment & Plan Note (Addendum)
Chronic issue Pt remains independent in her iADLs.  No evidence of significant progression. Continue to monitor function at each visit.

## 2014-06-06 NOTE — Patient Instructions (Signed)
Keep up your exercise.  Stop Fosamax.  Start Boniva, one tablet by mouth every month to help keep your bones strong.

## 2014-06-06 NOTE — Assessment & Plan Note (Signed)
Chronic issue Stable. No clinical evidence of end organ damage.  While seated pressures are elevated, standing pressures are less than 140/90.  Patient has history of near-syncope and fatigue with antihypertensive therapy in the past. Will continue to monitor.  No plans to initiate antihypertensive pharmacotherapy.

## 2014-06-06 NOTE — Assessment & Plan Note (Signed)
Chronic Issue Patient has stopped taking her alendronate because she says it makes her not feel well for several days after her weekly dose.  She denies GI upset with medication. She has continued the Theraband exercises daily.   Recommend trial on Boniva 150 mg once monthly. Continue calcium and vitamin d supplementation daily.

## 2014-06-06 NOTE — Assessment & Plan Note (Signed)
No new falls. Exercising.  Recent eye exam without evidence of decline in vision per patient report. No glaucoma. No mac degeneration.

## 2014-06-07 MED ORDER — IBANDRONATE SODIUM 150 MG PO TABS: 150.0000 mg | ORAL_TABLET | ORAL | Status: DC

## 2014-06-07 NOTE — Telephone Encounter (Signed)
Rx for Boniva sent to [facility] in [place].

## 2014-09-19 ENCOUNTER — Encounter: Payer: Self-pay | Admitting: Family Medicine

## 2014-09-19 ENCOUNTER — Ambulatory Visit (INDEPENDENT_AMBULATORY_CARE_PROVIDER_SITE_OTHER): Payer: Medicare Other | Admitting: Family Medicine

## 2014-09-19 ENCOUNTER — Telehealth: Payer: Self-pay | Admitting: Family Medicine

## 2014-09-19 VITALS — BP 160/98 | HR 98 | Temp 97.8°F | Wt 120.1 lb

## 2014-09-19 DIAGNOSIS — I10 Essential (primary) hypertension: Secondary | ICD-10-CM | POA: Diagnosis not present

## 2014-09-19 DIAGNOSIS — R634 Abnormal weight loss: Secondary | ICD-10-CM | POA: Diagnosis not present

## 2014-09-19 DIAGNOSIS — Z720 Tobacco use: Secondary | ICD-10-CM | POA: Diagnosis not present

## 2014-09-19 DIAGNOSIS — M810 Age-related osteoporosis without current pathological fracture: Secondary | ICD-10-CM

## 2014-09-19 DIAGNOSIS — R4189 Other symptoms and signs involving cognitive functions and awareness: Secondary | ICD-10-CM | POA: Diagnosis not present

## 2014-09-19 DIAGNOSIS — F17200 Nicotine dependence, unspecified, uncomplicated: Secondary | ICD-10-CM

## 2014-09-19 LAB — CBC WITH DIFFERENTIAL/PLATELET
BASOS PCT: 0 % (ref 0–1)
Basophils Absolute: 0 10*3/uL (ref 0.0–0.1)
EOS ABS: 0 10*3/uL (ref 0.0–0.7)
Eosinophils Relative: 1 % (ref 0–5)
HEMATOCRIT: 39 % (ref 36.0–46.0)
Hemoglobin: 13.2 g/dL (ref 12.0–15.0)
LYMPHS PCT: 31 % (ref 12–46)
Lymphs Abs: 1.5 10*3/uL (ref 0.7–4.0)
MCH: 34.3 pg — AB (ref 26.0–34.0)
MCHC: 33.8 g/dL (ref 30.0–36.0)
MCV: 101.3 fL — AB (ref 78.0–100.0)
MPV: 9.2 fL (ref 8.6–12.4)
Monocytes Absolute: 0.4 10*3/uL (ref 0.1–1.0)
Monocytes Relative: 8 % (ref 3–12)
NEUTROS ABS: 2.9 10*3/uL (ref 1.7–7.7)
NEUTROS PCT: 60 % (ref 43–77)
PLATELETS: 191 10*3/uL (ref 150–400)
RBC: 3.85 MIL/uL — ABNORMAL LOW (ref 3.87–5.11)
RDW: 13.1 % (ref 11.5–15.5)
WBC: 4.8 10*3/uL (ref 4.0–10.5)

## 2014-09-19 LAB — COMPREHENSIVE METABOLIC PANEL
ALBUMIN: 4.4 g/dL (ref 3.5–5.2)
ALT: 8 U/L (ref 0–35)
AST: 21 U/L (ref 0–37)
Alkaline Phosphatase: 43 U/L (ref 39–117)
BUN: 16 mg/dL (ref 6–23)
CO2: 25 mEq/L (ref 19–32)
Calcium: 9.5 mg/dL (ref 8.4–10.5)
Chloride: 106 mEq/L (ref 96–112)
Creat: 0.7 mg/dL (ref 0.50–1.10)
GLUCOSE: 96 mg/dL (ref 70–99)
POTASSIUM: 3.9 meq/L (ref 3.5–5.3)
SODIUM: 140 meq/L (ref 135–145)
Total Bilirubin: 0.6 mg/dL (ref 0.2–1.2)
Total Protein: 6.5 g/dL (ref 6.0–8.3)

## 2014-09-19 LAB — TSH: TSH: 2.507 u[IU]/mL (ref 0.350–4.500)

## 2014-09-19 LAB — POCT SEDIMENTATION RATE: POCT SED RATE: 9 mm/hr (ref 0–22)

## 2014-09-19 NOTE — Patient Instructions (Addendum)
If you continue to lose weight, Dr Chessa Barrasso will recommend a Chest X-ray because of weight loss and cough.  Your memory is decreased some, but as long as you remain able to perform your activities independent, I would not recommend any treatments.  Stop smoking.

## 2014-09-19 NOTE — Telephone Encounter (Signed)
Spoke with pt's daughter Vaughan Basta) as she wanted to let you aware of some concerns for pt. She stated that pt has been drinking a lot of cough syrup (2-3 large bottles) in less than a week. She's forgetful, not using walker/cane like she is suppose to, does not hear very well and not sure if pt had a bad nightmare or was hallucinating a few nights ago (pt stated dog was chasing her). Vaughan Basta wanted you to address this with pt today at her appt. Franck Vinal, CMA.

## 2014-09-19 NOTE — Telephone Encounter (Signed)
pts daughter would like to speak with RN re: patients upcoming appt today, just needs to make pcp aware of some things prior to the appt

## 2014-09-20 ENCOUNTER — Encounter: Payer: Self-pay | Admitting: Family Medicine

## 2014-09-20 NOTE — Assessment & Plan Note (Signed)
Chronic Stable Patient declines starting an antihypertensive. She is happy with the BPs she measures at home.

## 2014-09-20 NOTE — Assessment & Plan Note (Signed)
Continues smoking two cigarettes a day Not interested in quitting at this time Continue to talk about smoking at each visit.

## 2014-09-20 NOTE — Assessment & Plan Note (Signed)
Chronic Stable Patient continues to be able to perform her iADLs independently Monitor function periodically

## 2014-09-20 NOTE — Assessment & Plan Note (Signed)
Chronic No evidence of symptomatic new fractures Ms Crew did not start the [place]. She declines the opportunity to try it.  She reports the bone building medications make her "feel bad". She is to continue on Vit D and calcium daily

## 2014-09-20 NOTE — Progress Notes (Signed)
Patient ID: Kathryn [name], female   DOB: Aug 05, 1924, 81 y.o.   MRN: [id number] Patient ID: Kathryn [name], female   DOB: Mar 13, 1925, 81 y.o.   MRN: [id number]   Subjective:    Patient ID: Kathryn [name], female    DOB: [birth year], 81 y.o.   MRN: [id number]  HPI  Problem List Items Addressed This Visit     Medium   TOBACCO DEPENDENCE - Longstanding issue - Smoking 2 to 4 cigarettes a day - No cough, no sputum, no shortness of breath - Pt not intereseted in quitting.    Mild Cognitive impairment - MMSE 23 out of 30 (10/2013) - iADLs: (+) Shopping, (+) Housework, (+) self administering medications, (+) Food prep, (+) telephone, (+) driving.  - No falls - Patient reports she takes OTC cough medicine 1 tsp at bedtime about three times a week - She denies having nightmares or having hallucinations of dogs chasing her.     HYPERTENSION, BENIGN ESSENTIAL (Chronic) Disease Monitoring  Blood pressure range: Says home BP less than 140 on top number   Chest pain: no   Dyspnea: no   Claudication: no   Preventitive Healthcare:  Exercise: daily walking, strength and balance.  Diet Pattern: has not had aas good an appetitie since death of her aunt two months ago.   Salt Restriction: no, regular diet.        Unprioritized   BACK PAIN, LUMBAR, CHRONIC - Longstanding - improved since last office visit. Only occasional, short duration pains in back now.  - Know significant progressive thoracolumbar dextroscoliosis with  Progression of DJD. - Taking scheduled APAP with hot packs improvement in back pain.  -  Pt Remaining active.  She is no longer using her Theraband -  Weight Loss - Slow weight loss - Is not taking nutrient shake supplements.  Eating cereal in morning, sandwich at lunch and meal prepared by dgt in evening. - Patient continues to deny cough, hemoptysis, night sweats, fever, abdominal pain, melena/hematochezia, hematuria, vaginal bleeding or pelvic heaviness sensation.  - She denies  prolonged sadness or loss of pleasure in life.   (+) smoking   Review of Systems     Objective:   Physical Exam VS reviewed GEN: Alert, Cooperative, Groomed, NAD HEENT: PERRL; EAC bilaterally not occluded, TM's translucent with normal LM, (+) LR; No cervical LAN, No thyromegaly, No palpable masses COR: RRR, No M/G/R, No JVD, Normal PMI size and location LUNGS: BCTA, No Acc mm use, speaking in full sentences EXT: No peripheral leg edema. Feet without deformity or lesions. Palpable bilateral pedal pulses.  Gait: Rose from chair without assistance and without using arms.  Kyphoscoliotic back, fair gait speed, No significant path deviation, Step through + Psych: Normal affect/thought/speech/language    Assessment & Plan:  No problem-specific assessment & plan notes found for this encounter.

## 2014-09-23 ENCOUNTER — Encounter: Payer: Self-pay | Admitting: Family Medicine

## 2014-09-23 DIAGNOSIS — D7589 Other specified diseases of blood and blood-forming organs: Secondary | ICD-10-CM | POA: Insufficient documentation

## 2014-09-23 HISTORY — DX: Other specified diseases of blood and blood-forming organs: D75.89

## 2014-10-31 ENCOUNTER — Telehealth: Payer: Self-pay | Admitting: Family Medicine

## 2014-10-31 ENCOUNTER — Encounter: Payer: Self-pay | Admitting: *Deleted

## 2014-10-31 NOTE — Telephone Encounter (Signed)
FYI: Spoke with pt and she seemed to be confused about a letter. I informed her that she contacted office this AM and said she needed a letter and then asked her what the letter needed to say. She did not understand but then she went on to saw she needed to know when her appt was with PCP. I told her it is on 12/05/14 @11am  with PCP and then she has eye doctor appt on 12/12/14. She only wanted to know when her appt was so I will mail her a letter for this and also told her that she will receive phone call reminder closer to her appt. Time. She verbalized understanding and apologized for the confusion. I told her that it was fine just wanted to be sure what she needed. Vannie Hochstetler, CMA.

## 2014-10-31 NOTE — Telephone Encounter (Signed)
Pt called and needs a letter so that she can see the eye doctor on May 26. Not sure if she needs a referral or just a letter since she said she has always taken a letter from Dr. McDiarmid to them to be seen. Please mail the letter to the patient and the address in Epic is correct. jw

## 2014-10-31 NOTE — Telephone Encounter (Signed)
Please clarifiy what letter Ms Iglesia needs.  What does the letter need to say?

## 2014-10-31 NOTE — Telephone Encounter (Signed)
Will forward to PCP for review. Shields Pautz, CMA. 

## 2014-12-05 ENCOUNTER — Ambulatory Visit (INDEPENDENT_AMBULATORY_CARE_PROVIDER_SITE_OTHER): Payer: Medicare Other | Admitting: Family Medicine

## 2014-12-05 ENCOUNTER — Encounter: Payer: Self-pay | Admitting: Family Medicine

## 2014-12-05 VITALS — BP 126/74 | HR 95 | Temp 98.0°F | Ht 62.0 in | Wt 121.3 lb

## 2014-12-05 DIAGNOSIS — R634 Abnormal weight loss: Secondary | ICD-10-CM | POA: Diagnosis not present

## 2014-12-05 DIAGNOSIS — D539 Nutritional anemia, unspecified: Secondary | ICD-10-CM

## 2014-12-05 DIAGNOSIS — D7589 Other specified diseases of blood and blood-forming organs: Secondary | ICD-10-CM

## 2014-12-05 LAB — CBC WITH DIFFERENTIAL/PLATELET
BASOS ABS: 0 10*3/uL (ref 0.0–0.1)
Basophils Relative: 0 % (ref 0–1)
Eosinophils Absolute: 0.1 10*3/uL (ref 0.0–0.7)
Eosinophils Relative: 1 % (ref 0–5)
HCT: 40.4 % (ref 36.0–46.0)
Hemoglobin: 13.9 g/dL (ref 12.0–15.0)
LYMPHS ABS: 1.6 10*3/uL (ref 0.7–4.0)
LYMPHS PCT: 27 % (ref 12–46)
MCH: 32.9 pg (ref 26.0–34.0)
MCHC: 34.4 g/dL (ref 30.0–36.0)
MCV: 95.7 fL (ref 78.0–100.0)
MPV: 9 fL (ref 8.6–12.4)
Monocytes Absolute: 0.4 10*3/uL (ref 0.1–1.0)
Monocytes Relative: 7 % (ref 3–12)
NEUTROS ABS: 3.8 10*3/uL (ref 1.7–7.7)
Neutrophils Relative %: 65 % (ref 43–77)
PLATELETS: 180 10*3/uL (ref 150–400)
RBC: 4.22 MIL/uL (ref 3.87–5.11)
RDW: 12.7 % (ref 11.5–15.5)
WBC: 5.8 10*3/uL (ref 4.0–10.5)

## 2014-12-05 LAB — FOLATE

## 2014-12-05 LAB — RETICULOCYTES
ABS Retic: 33.8 10*3/uL (ref 19.0–186.0)
RBC.: 4.22 MIL/uL (ref 3.87–5.11)
RETIC CT PCT: 0.8 % (ref 0.4–2.3)

## 2014-12-05 LAB — VITAMIN B12: VITAMIN B 12: 436 pg/mL (ref 211–911)

## 2014-12-05 NOTE — Assessment & Plan Note (Signed)
Diagnosis uncertain. Will check Vit B12 and folate, retic count and cbc with diff.

## 2014-12-05 NOTE — Patient Instructions (Signed)
Dr Cherita Hebel will call you if your tests are not good. Otherwise he will send you a letter.  If you sign up for MyChart online, you will be able to see your test results once Dr Jonathen Rathman has reviewed them.  If you do not hear from [place] with in 2 weeks please call our office  We are checking your Vitamin B12 level today and your Folate level to make sure you do not have Pernicious Anemia which is common as we age.   Keep eating meats to help keep your Vitamin B12 levels up.   It is OK to have some Fried chicken. We need to put a little weight on you.

## 2014-12-05 NOTE — Assessment & Plan Note (Addendum)
Stable 6 pounds weight loss since 10/2013. No constitutional symptoms Medications - noe Eating - no problem Appetite - reported as good Late onset psychosis- no Swallowing pxs- no Oral problems (e.g., poorly fitting dentures, caries): no    No money - no Wandering - no Hyperthryroid - no Enteric pxs - no Emotional Depression - Denies sadness and ahedonia Low salt or low fat diet: no Stones; no biliary symptoms.   Will continue to monitor.

## 2014-12-05 NOTE — Progress Notes (Signed)
Patient ID: Kathryn [name], female   DOB: [birth year], 81 y.o.   MRN: [id number] Patient ID: Kathryn [name], female   DOB: 01-10-1925, 81 y.o.   MRN: [id number]   Subjective:    Patient ID: Kathryn [name], female    DOB: [birth year], 81 y.o.   MRN: [id number]  HPI  Problem List Items Addressed This Visit     Medium   TOBACCO DEPENDENCE - Longstanding issue - Smoking 2 to 4 cigarettes a day - No cough, no sputum, no shortness of breath - Pt not intereseted in quitting.    Mild Cognitive impairment - MMSE 23 out of 30 (10/2013) -No change in ability to perform all ADLs and iADLS.  - No falls      HYPERTENSION, BENIGN ESSENTIAL (Chronic) Disease Monitoring  Blood pressure range: Says home BP less than 140 on top number   Chest pain: no   Dyspnea: no   Claudication: no   Preventitive Healthcare:  Exercise: daily walking, strength and balance.  Diet Pattern: has not had aas good an appetitie since death of her aunt two months ago.   Salt Restriction: no, regular diet.       Weight Loss - Eating fried chicken from [employer] several times a week.  No difficulty shopping for herself.  - Is not taking nutrient shake supplements.  Eating cereal in morning, sandwich at lunch and meal prepared by dgt in evening. - Patient continues to deny cough, hemoptysis, night sweats, fever, abdominal pain, melena/hematochezia, hematuria, vaginal bleeding or pelvic heaviness sensation.  - She denies prolonged sadness or loss of pleasure in life.   Macrocytosis - found on recent CBC.  No anemia. - Denies alcohol use for over 25 years. - No liver disease nor jaundice. - No medications.  - Eats meats - no intestinal surgeries.  - No numbness in feet, no imbalance with walking  (+) smoking   Review of Systems     Objective:   Physical Exam VS reviewed GEN: Alert, Cooperative, Groomed, NAD HEENT: PERRL; EAC bilaterally not occluded, TM's translucent with normal LM, (+) LR; No cervical LAN, No  thyromegaly, No palpable masses COR: RRR, No M/G/R, No JVD, Normal PMI size and location LUNGS: BCTA, No Acc mm use, speaking in full sentences EXT: No peripheral leg edema. Feet without deformity or lesions. Palpable bilateral pedal pulses. Intact to monofilament in 10/10 points on soles.  Gait: Rose from chair without assistance and without using arms.  Kyphoscoliotic back, fair gait speed, No significant path deviation, Step through + Psych: Normal affect/thought/speech/language    Assessment & Plan:  No problem-specific assessment & plan notes found for this encounter.

## 2014-12-05 NOTE — Addendum Note (Signed)
Addended byWendy Poet, Edith Lord D on: 12/05/2014 05:06 PM   Modules accepted: Level of Service

## 2014-12-06 ENCOUNTER — Encounter: Payer: Self-pay | Admitting: Family Medicine

## 2014-12-12 ENCOUNTER — Ambulatory Visit: Payer: Medicare Other | Admitting: Family Medicine

## 2014-12-26 ENCOUNTER — Telehealth: Payer: Self-pay | Admitting: Family Medicine

## 2014-12-26 NOTE — Telephone Encounter (Signed)
Will forward to Dr. McDiarmid. Jazmin Hartsell,CMA  

## 2014-12-26 NOTE — Telephone Encounter (Signed)
Mrs. Kathryn [name] is calling and seemed a bit upset, says "it's a sin for a child to treat their mother like my daughter treats me." But her reason for calling today was to let Dr. McDiarmid know that she is okay and she is taking her medication like she is supposed to, she just gets upset sometimes. But she assures me that she will make an appointment with him soon. I told her that if there was anything at all that we could do for her to just let us know. Thank you, Fonda Kinder, ASA

## 2015-01-16 ENCOUNTER — Ambulatory Visit (INDEPENDENT_AMBULATORY_CARE_PROVIDER_SITE_OTHER): Payer: Medicare Other | Admitting: Family Medicine

## 2015-01-16 VITALS — BP 157/79 | HR 95 | Temp 97.9°F | Ht 62.0 in | Wt 119.1 lb

## 2015-01-16 DIAGNOSIS — H905 Unspecified sensorineural hearing loss: Secondary | ICD-10-CM

## 2015-01-16 DIAGNOSIS — Z720 Tobacco use: Secondary | ICD-10-CM | POA: Diagnosis not present

## 2015-01-16 DIAGNOSIS — R634 Abnormal weight loss: Secondary | ICD-10-CM

## 2015-01-16 DIAGNOSIS — R4189 Other symptoms and signs involving cognitive functions and awareness: Secondary | ICD-10-CM

## 2015-01-16 DIAGNOSIS — F17200 Nicotine dependence, unspecified, uncomplicated: Secondary | ICD-10-CM

## 2015-01-16 NOTE — Patient Instructions (Addendum)
Your memory has declined some since last visit in 2015.  Stay active. Take classes with others.  It improves people's memories.

## 2015-01-17 ENCOUNTER — Encounter: Payer: Self-pay | Admitting: Family Medicine

## 2015-01-17 DIAGNOSIS — F0390 Unspecified dementia without behavioral disturbance: Secondary | ICD-10-CM

## 2015-01-17 HISTORY — DX: Unspecified dementia, unspecified severity, without behavioral disturbance, psychotic disturbance, mood disturbance, and anxiety: F03.90

## 2015-01-17 NOTE — Assessment & Plan Note (Addendum)
Established problem Worsening cognitive score on MMSE today, 17/30 compared to 22/30 in office visit April 2015.  Remains independent in all iADLs and ADLs, so no overt impairment of fucntion from cognitive decline. No evidence of decline in MMSE from mental disorder, substance abuse, or medical problem.   Will continue to monitor MMSE and function during office visits with me.

## 2015-01-17 NOTE — Progress Notes (Signed)
Patient ID: Kathryn [name], female   DOB: 04-30-1925, 81 y.o.   MRN: [id number] Patient ID: Kathryn [name], female   DOB: 01-May-1925, 81 y.o.   MRN: [id number]   Subjective:    Patient ID: Kathryn [name], female    DOB: 06/14/1925, 81 y.o.   MRN: [id number]  HPI  Problem List Items Addressed This Visit     Medium   TOBACCO DEPENDENCE - Longstanding issue - Smoking 2 to 4 cigarettes a day - No cough, no sputum, no shortness of breath - Pt not intereseted in quitting.    Mild Cognitive impairment - MMSE 23 out of 30 (10/2013) - Pt acknowledges some difficulty with memory -No change in ability to perform all ADLs and iADLS - Driving to familiar local place.  No night driving.  No major highway driving. - continues to care for her dogs.  - No falls - Reports feeling estranged from her dgt with whom she lives.  She has one other living son with whom she gets along.  He lives in [place].  - Kathryn [name] is talking about moving to an [place].  She has not discussed this with her dgt. She asked that I not discuss this with her dgt until she broaches the subject with her.      HYPERTENSION, BENIGN ESSENTIAL (Chronic) Disease Monitoring  Blood pressure range: Says home BP less than 140 on top number   Chest pain: no   Dyspnea: no   Claudication: no   Preventitive Healthcare:  Exercise: daily walking, strength and balance.  Diet Pattern: has not had aas good an appetitie since death of her aunt two months ago.   Salt Restriction: no, regular diet.       Weight Loss - Continues to lose weight but denies decrease in amount she is eating - Eating fried chicken from [employer] several times a week.  No difficulty shopping for herself.  - Is not taking nutrient shake supplements.  Eating cereal in morning, sandwich at lunch and meal prepared by dgt in evening. - Patient continues to deny cough, hemoptysis, night sweats, fever, abdominal pain, melena/hematochezia,  hematuria, vaginal bleeding or pelvic heaviness sensation.  - She denies prolonged sadness or loss of pleasure in life.   (+) smoking   Review of Systems     Objective:   Physical Exam  Constitutional: No distress.  thin  Neck: No thyromegaly present.  Cardiovascular: Normal rate, regular rhythm and normal heart sounds.  Exam reveals no gallop.   No murmur heard. Pulmonary/Chest: Effort normal and breath sounds normal.  Abdominal: Soft. Bowel sounds are normal.  Musculoskeletal: She exhibits no edema.  Neurological: She is alert.  Talkative, social, concrete  Skin: Skin is warm.  Psychiatric: Her behavior is normal.   MMSE - Mini Mental State Exam 01/16/2015 11/01/2013 06/22/2013 08/17/2012  Orientation to time 3 5 5 3   Orientation to Place 3 4 4 2   Registration 3 3 3 3   Attention/ Calculation 1 0 0 4  Recall 0 2 1 2   Language- name 2 objects 2 2 2 2   Language- repeat 0 1 1 1   Language- follow 3 step command 3 3 3 3   Language- read & follow direction 1 1 1 1   Write a sentence 1 1 1 1   Copy design 0 0 0 1  Total score 17 22 21 23          Assessment & Plan:  No problem-specific assessment &  plan notes found for this encounter.

## 2015-01-17 NOTE — Assessment & Plan Note (Signed)
Wt Readings from Last 3 Encounters:  01/16/15 119 lb 1 oz (54.006 kg)  12/05/14 121 lb 5 oz (55.027 kg)  09/19/14 120 lb 1 oz (54.46 kg)  Established problem Stable weight over last 3 months Ms Weir declined further workup for malignancy, including chest xray to look for malignancy in smoker.  Patient says she is ready for whatever comes and does not want to "go looking for trouble".

## 2015-01-17 NOTE — Assessment & Plan Note (Signed)
Established problem Stable No further workup Pt not interested in pursuing Hearing assistance.  I let patient know we would be glad to assist her in referral for hearing aid assessment if she desired.

## 2015-01-17 NOTE — Assessment & Plan Note (Signed)
Established problem Stable No further workup Patient precontemplative phase of change. Reminded of benefits of stopping smoking and offered to help when she chooses to change.

## 2015-03-25 ENCOUNTER — Telehealth: Payer: Self-pay | Admitting: Family Medicine

## 2015-03-25 NOTE — Telephone Encounter (Signed)
Spoke with daughter Vaughan Basta and she is concerned for patient.  States that she will be bringing patient on Thursday for her appt but doesn't usually come into the room.  States that mother has had increased forgetfulness and has increased her smoking amount.  She also states that she has been drinking a lot of cough syrup and has mentioned not wanting to be alive.  Advised daughter that I forward to MD.  Please call daughter with questions. Jazmin Hartsell,CMA

## 2015-03-25 NOTE — Telephone Encounter (Signed)
I spoke with patient's daughter, Kathryn [name], at her request.  Kathryn [name] informed me of her concerns about her mother who lives with her.   Kathryn [name] related that her mother is having difficulty with driving.  That she is becoming more forgetful with things like names of familiar people and recent conversations.   Kathryn [name] found her mother sitting in the patient's car with a man who neither the patient nor Kathryn [name] could identify.  Kathryn [name] found 6 bottles of a night-time cough syrup (? [employer]-Nighttime) hidden in her mother's underwear drawer. Kathryn [name] is afraid to leave her mother unsupervised.  Dgt plans on using LTC insurance to hire care providers to stay with the patient when she must leave the home.   I told Kathryn [name] that if she is concerned about her mother's safety, that she could contact her county Adult Protective Services to ask for an evaluation of patient for cognitive impairment severity that would place the patient at significant risk of harm or self neglect.  I advised Kathryn [name] that she could contact the [facility] DMV Driver Medical Evaluation program to express her concern about the safety of her mother who is still driving.   I will see Kathryn [name] this Thrs.  We will repeat her cogntivie testing and perform a medical driving examination in preparation for documentation of an opinion as to patient's ability to drive safely.  Will need eye exam.

## 2015-03-25 NOTE — Telephone Encounter (Signed)
The patients daughter called and would like to speak to Dr. McDiarmid about her mother. Kathryn [name]

## 2015-03-26 NOTE — Patient Instructions (Addendum)
Take cough medicine that does not have the letter PM or Nighttime in the name.  The medicines with PM or Nighttime in the name contain antihistamines that can interfere with your memory and balance.

## 2015-03-26 NOTE — Progress Notes (Signed)
Subjective:    Patient ID: Kathryn [name], female    DOB: [birth year], 81 y.o.   MRN: [id number] [name] was brought to ov by her dgt Andre Lefort) and son-in-law.   [name] was alone for history and exam  [name] had called me yesterday with concerns about her mother, including her safety driving, worsening of memory, stranger safety, Cough syrup use/abuse  [name] was the source of information in office today.  HPI Driving Assessment Enjoys driving.  She can tell when she is not able to drive How did patient get to office today?   dgt and son-in-law drove her  Has patient had any problems driving?   No, per patient  Have others expressed concerns about patient's driving?   Yes, daughter  How much does the patient drive?   Every 2 to 3 day Talking about driving to [place] to see relatives  Does patient have trouble driving during the day?  No;  At night?  She does not drive at night;  In bad weather?   She does not drive in bad weather;  On busy highways?  yes  Does patient have trouble with the steering wheel?  no;  Reading or understanding road signs?  no;  Have difficulty at cross-roads?  no  Does patient think they are a safe driver?  yes  Does patient ever get lost while driving?  She recalls a road detour she had to take while trying to det home.  She drove for many miles and was unable to figure out how to drive home.  Eventually she stopped at a [employer] where a man told her how to get back home.  She report being able to get home with these directions.   Has patient received any traffic violations or warnings in the past 2 years?  no  Has patient had any car crashes or near crashes in the past 2 years?  no  If the patient's car broke down, how would they get around?  Quit driving.        Who could give rides?  Shanon Brow, her son-in-law       Have you ever used community or Materials engineer services?  No   Driving Relevant PMH  Heart attack: no Lung  problems: no Stroke / TIA: No Traumatic Brain Injury: no Seizures: no Eye problems: no  Memory loss: Yes Alcohol abuse: No Substance abuse: Concern from dgt about OTC cough syrup abuse Sleep Apnea: No Peripheral Neuropathy / loss of feeling in limbs: No Limb weakness / stiffness: no Diabetes / hypoglycemia: No Foot abnormalities: no Osteoarthritis: yes, but not limiting  No prescription medications   Cough syrup use/abuse: - [name] reports that she just takes a swallow of cough medication at night before going to bed to help with her chronic cough. She denies taking any more than this amount and just at bedtime. She denies getting a high feeling from the cough medication.  She denies becoming confused when she takes it.  - [name] reports finding 6 bottles of Equate Nitetime cough syrup under the patient's underwear in the patient's underwear drawer.  This cough syrup contains both a first generation antihistamine, doxylamine, and dextromethorphan.    Activites of Daily Living ADLs Independent Needs Assistance Dependent  Bathing x    Dressing x    Ambulation x    Toileting x    Eating x     IADL Independent Needs Assistance Dependent  Cooking x    Housework x    Manage Medications x    Manage the telephone x    Shopping for food, clothes, Meds, etc x    Use transportation x    Manage Finances  x    Review of Systems ROS Lightheadedness / near-syncope: No Syncope: No Vertigo: No Hypoglycemia: NO Chest pain: No Sleep Attacks: No      Objective:   Physical Exam  Physical  Visual Fields:  Full visual fields  Get up and Go test:  < 15 secodns  Range of Motions Neck:  WNL Shoulder / elbow Simulated Turn:  WNL Hand Grasp:  WNL Ankle Plantar Flesion:  WNL Ankle Dorsiflexion:  WNL  Motor Strength (x/5 scale) Right Hand:  5  Left Hand:  5 Right Elbow:  5 Left Elbow:  5 Right Shoulder:  5 Left Shoulder:  5 Right Hip :  5  Left Hip:  5 Right Knee:   5 Left Knee:  5 Right Ankle:  5 Left Ankle:  5  Cognitive Testing (03/27/15) MMSE 19 out of 30  PHQ-2 0 out of 2   MMSE - Mini Mental State Exam 03/27/2015 01/16/2015 11/01/2013 06/22/2013 08/17/2012  Orientation to time 4 3 5 5 3   Orientation to time comments - - - 5 date 28, month Feb  Orientation to Place 4 3 4 4 2   Orientation to Place-comments - - - - did not know county or state   Registration 3 3 3 3 3   Attention/ Calculation 1 1 0 0 4  Recall 0 0 2 1 2   Language- name 2 objects 2 2 2 2 2   Language- repeat 1 0 1 1 1   Language- follow 3 step command 3 3 3 3 3   Language- read & follow direction 0 1 1 1 1   Write a sentence 1 1 1 1 1   Copy design 0 0 0 0 1  Total score 19 17 22 21 23          Assessment & Plan:

## 2015-03-27 ENCOUNTER — Telehealth: Payer: Self-pay | Admitting: Family Medicine

## 2015-03-27 ENCOUNTER — Encounter: Payer: Self-pay | Admitting: Family Medicine

## 2015-03-27 ENCOUNTER — Ambulatory Visit (INDEPENDENT_AMBULATORY_CARE_PROVIDER_SITE_OTHER): Payer: Medicare Other | Admitting: Family Medicine

## 2015-03-27 VITALS — BP 150/82 | HR 88 | Temp 98.2°F | Ht 62.0 in | Wt 122.4 lb

## 2015-03-27 DIAGNOSIS — Z23 Encounter for immunization: Secondary | ICD-10-CM | POA: Diagnosis not present

## 2015-03-27 DIAGNOSIS — F1110 Opioid abuse, uncomplicated: Secondary | ICD-10-CM

## 2015-03-27 DIAGNOSIS — F0390 Unspecified dementia without behavioral disturbance: Secondary | ICD-10-CM

## 2015-03-27 DIAGNOSIS — F03A0 Unspecified dementia, mild, without behavioral disturbance, psychotic disturbance, mood disturbance, and anxiety: Secondary | ICD-10-CM

## 2015-03-27 DIAGNOSIS — I10 Essential (primary) hypertension: Secondary | ICD-10-CM | POA: Diagnosis not present

## 2015-03-27 DIAGNOSIS — R634 Abnormal weight loss: Secondary | ICD-10-CM

## 2015-03-27 DIAGNOSIS — F1910 Other psychoactive substance abuse, uncomplicated: Secondary | ICD-10-CM

## 2015-03-27 NOTE — Telephone Encounter (Signed)
Daughter called. She doesn't want her mother to know she is talking to Dr McDiarmid. At her mothers appt today, she will be in the car waiting. Dr Wendy Poet can call the daughter on the number given to talk to her or use My Chart to discuss her mothers visi. Mother doesn't use My Chart

## 2015-03-27 NOTE — Telephone Encounter (Signed)
Will forward to PCP so that he is aware of this. Sarea Fyfe, CMA.

## 2015-03-28 ENCOUNTER — Encounter: Payer: Self-pay | Admitting: Family Medicine

## 2015-03-28 DIAGNOSIS — F1910 Other psychoactive substance abuse, uncomplicated: Secondary | ICD-10-CM

## 2015-03-28 HISTORY — DX: Other psychoactive substance abuse, uncomplicated: F19.10

## 2015-03-28 NOTE — Assessment & Plan Note (Addendum)
New diagnosis Now with a dependence in an iADL (managing finances) FAST Level 4  Possible Behavioral and Psychological Symptoms of Dementia with inappropriate expressions of anger and defiance towards caretaking daughter and concern for unsafe driving and association with strangers.   I have informed patient's Dgt, Ms Andre Lefort that she may contact the [facility] requesting an assessment about her mother's driving safety.  I believe it would be appropriate for the [facility] to have Ms Rockhold take a road driving test to assess her ability to drive safely. In the meantime, I have recommended to Ms Carelli that she only drive during the day and only during good weather.  I recommended against Highway driving and recommend she keep her driving to local, familiar routes.   From my experience with Ms Vitrano, I believe she would be very resistant to taking a medication, like Donepezil.  Donepezil could be tried to see if it would help with the inappropriate behaviours that her dgt has been experiencing from her mother.

## 2015-03-28 NOTE — Assessment & Plan Note (Signed)
New problem Patient denies excess use of Equate Nitetime cough syrup containing Doxylamine and Dextromethorphan.  If an entire 12-ounce bottle of this cough syrup were ingested over a relatively short period of time, there could be a mild stimulant to mild euphoriant effect from the metabolites of dextromethorphan.  The doxylamine first generation antihistamine would have an anticholinergic effect even with the prescribed dosing in older adult effecting their cognition, memory, balance, and risk of falls.   I encouraged Ms Kathryn [name] to stop using this brand of Cough medicine, rather for her to use plain Robitussin (just plain guaifenesin) for her cough.  If she still is using cough medications at follow up visit, will recommend a CXR for her persistent cough.

## 2015-03-28 NOTE — Assessment & Plan Note (Signed)
Established problem. Stable weight over the last year. Continue to monitor

## 2015-03-28 NOTE — Assessment & Plan Note (Signed)
Established Problem Stable. Mildly elevated SBP No overt clinical evidence of acute end organ damage.  Patient continues to resist starting an antihypertensive.

## 2015-06-19 ENCOUNTER — Ambulatory Visit (INDEPENDENT_AMBULATORY_CARE_PROVIDER_SITE_OTHER): Payer: Medicare Other | Admitting: Family Medicine

## 2015-06-19 ENCOUNTER — Encounter: Payer: Self-pay | Admitting: Family Medicine

## 2015-06-19 VITALS — BP 154/78 | HR 97 | Temp 97.9°F | Ht 62.0 in | Wt 120.2 lb

## 2015-06-19 DIAGNOSIS — E7800 Pure hypercholesterolemia, unspecified: Secondary | ICD-10-CM | POA: Diagnosis not present

## 2015-06-19 DIAGNOSIS — F17200 Nicotine dependence, unspecified, uncomplicated: Secondary | ICD-10-CM | POA: Diagnosis not present

## 2015-06-19 DIAGNOSIS — F1110 Opioid abuse, uncomplicated: Secondary | ICD-10-CM | POA: Diagnosis not present

## 2015-06-19 DIAGNOSIS — F0390 Unspecified dementia without behavioral disturbance: Secondary | ICD-10-CM | POA: Diagnosis not present

## 2015-06-19 DIAGNOSIS — H6123 Impacted cerumen, bilateral: Secondary | ICD-10-CM

## 2015-06-19 DIAGNOSIS — F1910 Other psychoactive substance abuse, uncomplicated: Secondary | ICD-10-CM

## 2015-06-19 DIAGNOSIS — I10 Essential (primary) hypertension: Secondary | ICD-10-CM | POA: Diagnosis not present

## 2015-06-19 DIAGNOSIS — F03A0 Unspecified dementia, mild, without behavioral disturbance, psychotic disturbance, mood disturbance, and anxiety: Secondary | ICD-10-CM

## 2015-06-19 NOTE — Progress Notes (Signed)
   Subjective:    Patient ID: Kathryn [name], female    DOB: 13-Oct-1924, 81 y.o.   MRN: [id number]  HPI  Hypertension - Longstanding problem - Does not check at home - refusing to take mediation - Denies chest pain, claudication, shortness of breath, worsening DOE, no PND -    Hyperlipidemia - Longstanding problem - Refuses to take statin  Dementia - Diagnosis 03/27/15 with dependence on in finances only - Now she is no longer driving, nor shopping as well - She says she is happy with her familynow.  That they bring her anything she wants from the store - Her dog remains a great pleasure to her.   Cough syrup use - Denies taking cough syrup  - denies cough - She cannot drive to drug store to purchase cough medications. -   (+) tobacco  Review of Systems No falls No abdominal pain    Objective:   Physical Exam  VS reviewed GEN: Alert, Cooperative, Groomed, NAD HEENT: PERRL; right EAC occluded with cerumen completely, left EAC occulded partially with cerumen, TM's translucent with normal LM, (+) LR;                No cervical LAN, No thyromegaly, No palpable masses COR: RRR, No M/G/R,  LUNGS: BCTA, No Acc mm use, speaking in full sentences  EXT: No peripheral leg edema. Feet without deformity or lesions. Palpable bilateral pedal pulses.  Neuro: Oriented to person, place, and year; Memory for recent events is impaired  Good recall of distant memories Psych: Normal affect/thought              Speech with more misdirections /language with more difficulty remembering names of people and things and places.          Assessment & Plan:

## 2015-06-19 NOTE — Patient Instructions (Signed)
I am glad that you have decided to stop driving and let your family help you with getting around.  The Life Line you are wearing is a great idea.  We cleaned out the wax in your ears today.  To keep the was from building back up, put a couple drops of mineral oil or baby oil in your ears twice a week.  This will keep the wax softer, allowing it to flow out of the ear on its own.

## 2015-06-19 NOTE — Assessment & Plan Note (Signed)
Established problem. Likely uncontrolled Patient stopped prior therapy and does not wish to restart.  Monitor for end organ damage.

## 2015-06-19 NOTE — Assessment & Plan Note (Signed)
Established problem, uncontrolled.  Patient continues to decline antihypertensive medication therapy No evidence of new acute end organ damage. Continue to monitor of end organ damage.

## 2015-06-19 NOTE — Assessment & Plan Note (Signed)
Establishe diagnosis Possible evidence of progression with stopping driving and shopping.  FAST Level 4 to 5 No evidence of behavioral and psychological symptoms of dementia displayed at previous ov in September.  Family is making appropriate adjustments to accommodate Ms Guardado's cognitive decline.   Will continue to monitor and address needs as they arise.

## 2015-06-19 NOTE — Assessment & Plan Note (Signed)
Established problem that has improved.  Now that Kathryn [name] is no longer driving so, I suspect she is no longer to purchase the Equate Nitetime (Doxylamine and Dextromethorphan cough medication)  An unintended benefit of Kathryn [name] stopping driving.

## 2015-06-19 NOTE — Assessment & Plan Note (Signed)
Established problem. Stable. Precontemplative status Reminded of benefits of smoking cessation or reduction.  Offer of help in future should she desire it.  She expressed gratitude for offer, but declined it.

## 2015-08-22 ENCOUNTER — Telehealth: Payer: Self-pay | Admitting: Family Medicine

## 2015-08-22 NOTE — Telephone Encounter (Signed)
Will forward to MD. Jazmin Hartsell,CMA  

## 2015-08-22 NOTE — Telephone Encounter (Signed)
See if daughter can bring Kathryn [name] in 1:45 pm this Monday 2/6 thru [place] appointment, then have [place] paged to see patient.

## 2015-08-22 NOTE — Telephone Encounter (Signed)
Daughter is calling and is very concerned about her mother. She thinks that she is getting worse and wanted to get her in but the doctor doesn't have anything until 10/02/15. She would like to speak to the doctor and see what she should do . jw

## 2015-08-25 NOTE — Telephone Encounter (Signed)
I didn't see this message until after 1:30pm on Monday, do you want me to bring her in on another day?  Jazmin Hartsell,CMA

## 2015-08-26 NOTE — Telephone Encounter (Signed)
Spoke with daughter Vaughan Basta and she scheduled patient for 09/02/15 @10 :30am with Dr. Cyndia Skeeters.  I informed her that Dr. McDiarmid would be paged when they arrived, so he can see patient.  Vaughan Basta states that patient is not comfortable with other providers and is not even comfortable with home health in her house.  Naman Spychalski,CMA

## 2015-08-26 NOTE — Telephone Encounter (Signed)
If patient needs to be seen quickly, she will need to be seen by [facility] physician.  I will be out of town starting in afternoon of 08/27/15 until 09/01/15. I will be glad to see Ms Trimarco for [place] [facility] appointment on 09/01/15 pm or 09/02/15 am.  Please let dgt know.

## 2015-09-02 ENCOUNTER — Ambulatory Visit: Payer: Medicare Other | Admitting: Student

## 2015-09-03 ENCOUNTER — Encounter: Payer: Self-pay | Admitting: Family Medicine

## 2015-09-03 NOTE — Progress Notes (Signed)
Patient ID: BRALEIGH FOORE, female   DOB: June 14, 1925, 82 y.o.   MRN: [id number] ROI form for Ms Ahriah Frankenberger (DOB Aug 20, 2024) signed by her daughter, Vaughan Basta gardner reviewed by me.  The  releasing iformation Is accompanied by a request for our medical records for Ms Sarles that pertain to her Cognitive ability for last year from [place] (dated 08/27/15) from their [place].   Office Visit notes for Ms Hackert from 03/26/16 and 06/18/16 with Sherren Mocha Marvia Troost were fax'd to [employer] [phone]).

## 2015-09-30 ENCOUNTER — Telehealth: Payer: Self-pay | Admitting: Family Medicine

## 2015-09-30 NOTE — Telephone Encounter (Signed)
Will forward to MD. Jazmin Hartsell,CMA  

## 2015-09-30 NOTE — Telephone Encounter (Signed)
Patient came today for an appointment but there was not an appointment for her. Patient asked for the soonest appointment because she hasn't been since December 2016. Patient's daughter asks PCP to provide information to [employer] about home health care. She left copy of the letter for PCP's knowledge; patient was scheduled for April 13 but she needs to be seen sooner. Please, follow up with Mr. Sharyl Nimrod at [phone].

## 2015-10-07 NOTE — Telephone Encounter (Signed)
Please clarify with Kathryn [name] the reason she needs to be seen sooner that 4/13. If it is for routine follow up, then she may wait until 4/13. If it is for a more acute reason, please offer her a SDA aapointment.

## 2015-10-13 NOTE — Telephone Encounter (Signed)
Spoke with daughter Vaughan Basta and patient's health is declining more quickly.  She is less coherent and not able to walk as well as she was.  She is concerned regarding this decline.  Will forward to MD. Johnney Ou

## 2015-10-13 NOTE — Telephone Encounter (Signed)
Tried calling patient but no answer.  Will try calling again. Jazmin Hartsell,CMA

## 2015-10-14 NOTE — Telephone Encounter (Signed)
If daughter, Vaughan Basta, believes patient needs to be seen before 4/13, then schedule an SDA with Donya Hitch for 1:30 pm on this Friday, 10/17/15.  Please call dgt to see if patient can wait or not.

## 2015-10-14 NOTE — Telephone Encounter (Signed)
Spoke with daughter and she would like for patient to be seen on Friday.  She was scheduled on Dr. Lisbeth Ply same day 1:30p and note placed for clinic staff to page you once patient has been room. Jazmin Hartsell,CMA

## 2015-10-17 ENCOUNTER — Encounter: Payer: Self-pay | Admitting: Family Medicine

## 2015-10-17 ENCOUNTER — Ambulatory Visit (INDEPENDENT_AMBULATORY_CARE_PROVIDER_SITE_OTHER): Payer: Medicare Other | Admitting: Family Medicine

## 2015-10-17 VITALS — BP 152/74 | HR 96 | Temp 98.1°F | Wt 117.0 lb

## 2015-10-17 DIAGNOSIS — Z7409 Other reduced mobility: Secondary | ICD-10-CM

## 2015-10-17 DIAGNOSIS — M48061 Spinal stenosis, lumbar region without neurogenic claudication: Secondary | ICD-10-CM

## 2015-10-17 DIAGNOSIS — M4806 Spinal stenosis, lumbar region: Secondary | ICD-10-CM | POA: Diagnosis not present

## 2015-10-17 DIAGNOSIS — Z9181 History of falling: Secondary | ICD-10-CM

## 2015-10-17 DIAGNOSIS — R634 Abnormal weight loss: Secondary | ICD-10-CM | POA: Diagnosis present

## 2015-10-17 DIAGNOSIS — F03B18 Unspecified dementia, moderate, with other behavioral disturbance: Secondary | ICD-10-CM

## 2015-10-17 DIAGNOSIS — F0391 Unspecified dementia with behavioral disturbance: Secondary | ICD-10-CM

## 2015-10-17 DIAGNOSIS — F17200 Nicotine dependence, unspecified, uncomplicated: Secondary | ICD-10-CM

## 2015-10-17 DIAGNOSIS — R296 Repeated falls: Secondary | ICD-10-CM | POA: Diagnosis not present

## 2015-10-17 HISTORY — DX: Other reduced mobility: Z74.09

## 2015-10-17 NOTE — Assessment & Plan Note (Signed)
Established problem worsened.  No evidence of cause external to the chronic progressive neurodegenerative process of Alzheimer's Dementia.  Encouraged family to liberalize Kathryn Beauchesne' food choices to include fried foods, if that is what she wants. Dgt was accepting of this.

## 2015-10-17 NOTE — Assessment & Plan Note (Signed)
Established problem. Stable.   

## 2015-10-17 NOTE — Assessment & Plan Note (Signed)
Established problem worsened.  Patient's ability to perform iADLs have declined to dependence and ADLs have declined to needing assistance.  ADL: Dress, eat, ambulate/transfer, toilet independently.  Needs assistance with bathing from personal care service provider. IADL: Dependence in financial management, transportation, phone use, food preparation; Needs assistance with shopping, housework, phone   FAST Stage 4 (mild) to 5 (moderate). Behavioral and psychological symptoms have developed with depressive symptoms without evidence of Major Depression.  No active plans for harming herself.  She said she would let her family know if she had a plan to hurt herself.  Suspiciousness that accompnies patient losing her things or hiding her things then forgetting where they are.   We discussed role of ACHEI possible risks and benefits.  The patient and the dgt declined this treatment option.  Dgt said she doubts her mother would actually take the medication were it prescribed as this has been her choice her whole life.

## 2015-10-17 NOTE — Progress Notes (Signed)
   Subjective:    Patient ID: Kathryn [name], female    DOB: Nov 25, 1924, 82 y.o.   MRN: [id number]  HPI Kathryn [name] is accompanied by patient, daughter and son-in-law Sources of clinical information for visit is/are patient, relative(s) and past medical records. Kathryn [name] is living her her daughter and son-in-law Nursing assessment for this office visit was reviewed with the patient for accuracy and revision.  Patient is receiving PCS services.   Dementia Cognitive impairment concern What problems with thinking are there? Increasing severity of memory and function.  When were the changes first noticed? several year ago but increasing more over last couple months Does their level of alertness change throughout the day? no  Is their speech disorganized, rambling? no  Has there been any tremors or abnormal movements? no  Have they had in hallucinations or delusions: no  Have they appeared more anxious or sad lately? yes, at times complaining when she is frustrated with her limitations that she wishes she were dead.   DEPRESSION Disease Monitoring Current symptoms include impaired memory, suicidal thoughts without plan, weight loss and sleep is ok.              Symptoms have been gradually worsening     Is Exercising not  Evidence of suicidal ideation: none Do they still have interests or activities they enjor doing? yes, her dog, shopping, when personal care aid  How has their appetite been lately? Slightly diminished. Eating a lot of cookies.  Does get one prepared meal a day from her dgt that has varied contents.   How has their sleep been lately? generally restful sleep  Problem behaviors: agitation and when can't find something she has hidden, believes someone may have taken it.  Remembering things that have happened recently? are worsening Recalling conversations a few days later? are worsening  Remembering what day and month it is? show no change  Remembering where things are  usually kept? are worsening  Losing things? are worsening  Learning to use a new gadget or machine around the house? are worsening, unable to use [employer] phone  Learning new things in general? are worsening  Following a story in a book or on TV? show no change  Handling money for shopping? are worsening. Dgt handles patient's financial Estate.  Getting lost? show no change  Asking same questions repeatedly or telling the same story repeatedly to the same person(s)? no    Falls Stumbles are occurring frequently.  No falls to ground or against objects. No injuries. (+) history of falls with injury several years ago. Not using walker or cane Using public scooters at stores. Low back and knees hurt with standing and walking   Review of Systems No dyspepsia No cosntipation No change in vision.     Objective:   Physical Exam  Gait: Rises from chair with assistance of arms, wide gait, feet do not pass each other with stride, slow gait.   Able to walk 100 feet without visible exhaustion or increased work of breathing. Did return to correct exam room spontaneously        Assessment & Plan:  40 minutes face to face where spent in total with counseling / coordination of care took more than 50% of the total time. Counseling involved discussion of the on diagnosis and natural history of Alzheimer's Dementia, role of medications, physical therapy, tricks to getting patient's to eat more at meals.

## 2015-10-17 NOTE — Assessment & Plan Note (Signed)
Established problem worsened.  Stumbling frequently Wide gait, slow pace, petite peds strides with feet barely clearing floor.  Recommend home PT evaluation and treatment of abnormal gait.  Pt declined starting Vitamin D.

## 2015-10-30 ENCOUNTER — Ambulatory Visit: Payer: Medicare Other | Admitting: Family Medicine

## 2015-10-30 ENCOUNTER — Encounter: Payer: Self-pay | Admitting: Family Medicine

## 2015-10-30 DIAGNOSIS — R54 Age-related physical debility: Secondary | ICD-10-CM | POA: Insufficient documentation

## 2015-11-20 ENCOUNTER — Ambulatory Visit: Payer: Medicare Other | Admitting: Family Medicine

## 2016-01-22 ENCOUNTER — Ambulatory Visit (INDEPENDENT_AMBULATORY_CARE_PROVIDER_SITE_OTHER): Payer: Medicare Other | Admitting: Family Medicine

## 2016-01-22 ENCOUNTER — Encounter: Payer: Self-pay | Admitting: Family Medicine

## 2016-01-22 VITALS — BP 167/79 | HR 92 | Temp 98.1°F | Ht 62.0 in | Wt 125.6 lb

## 2016-01-22 DIAGNOSIS — F0391 Unspecified dementia with behavioral disturbance: Secondary | ICD-10-CM

## 2016-01-22 DIAGNOSIS — R634 Abnormal weight loss: Secondary | ICD-10-CM

## 2016-01-22 DIAGNOSIS — F17200 Nicotine dependence, unspecified, uncomplicated: Secondary | ICD-10-CM

## 2016-01-22 DIAGNOSIS — I10 Essential (primary) hypertension: Secondary | ICD-10-CM | POA: Diagnosis not present

## 2016-01-22 DIAGNOSIS — F03B18 Unspecified dementia, moderate, with other behavioral disturbance: Secondary | ICD-10-CM

## 2016-01-22 NOTE — Patient Instructions (Signed)
The more socialization that Kathryn [name] would be very good for her.    Going to the Senior center or Adult Day Care would be very good for her.   Taking her out to eat would be a good activity for her.   Keeping her life structured as you have is wonderful.

## 2016-01-23 ENCOUNTER — Encounter: Payer: Self-pay | Admitting: Family Medicine

## 2016-01-23 NOTE — Progress Notes (Signed)
   Subjective:    Patient ID: Kathryn Kathryn [name], female    DOB: 04-Apr-1925, 82 y.o.   MRN: [id number] Kathryn Kathryn [name] is accompanied by daughter Kathryn Kathryn [name]), adult caretaker and son-in-law Sources of clinical information for visit is/are patient, relative(s) and caregiver / friend. Nursing assessment for this office visit was reviewed with the patient for accuracy and revision.   HPI  Dementia / Cognitive impairment - Onset: Diagnosis 03/2015 - Change in ADLs / iADLs: Continues to perform all her own ADLs, She receives assistance with her iADLs form family and her hired caretaker, Kathryn Kathryn [name].  Sally stays with Kathryn Kathryn [name] during the day 3 days out of the week.  Kathryn Kathryn [name] has 24/7 supe3rvision per family - no changes in personality or behaviors. -  Occasionally talks to people who are not their.  Kathryn Kathryn [name] deneis seeing or hearing things that scare her.  -  Occasional verbal aggression around feeling stuck in home, not being allowed to drive.  Verbal aggresion tends to be directed towards her dgt. No inappropriate behaviors - Kathryn [name] disturbance staying up and waliking about her room -  No wandering outside home - no tearful / sad appearing - no anxiousness / repeated queries / money concerns / abandonment fears / perseverating  - Dementia Medications : none  - FAST Stage (4- needs help with iADLs; - Associated symptoms: No Weight loss,    Weight Loss - Onset last year - Eating with her hired caretaker a varied diet that is prepared for her by pt's dgt.  - (+) weight gain. No significant increase in chronic ankle edema - No shortness or breath, no orthopnea, no diarreha, no abdominal pain, no dysphagia  Reviewed PMH and Medications and were updated.  (+) smoking- family dispenses cigarettes.  Smoking only in backyard.  Smoking less than a cigarette a week.   Review of Systems No fever No joint swelling    Objective:   Physical Exam  VS reviewed GEN: Alert, Cooperative, Groomed, NAD HEENT:  PERRL; EAC bilaterally not occluded, TM's translucent with normal LM, (+) LR;                No cervical LAN, No thyromegaly, No palpable masses COR: RRR, No M/G/R, No JVD, Normal PMI size and location LUNGS: BCTA, No Acc mm use, speaking in full sentences EXT: bilateral ankle edema. (+)1 DP pulses bil. SKIN: No lesion nor rashes of face/trunk/extremities Neuro: Normal muscle tone in wrists, elbows, shoulder bilaterally  Gait: slow speed, (+) petite peds, short stride with minimal foot lift with stride, four steps to teurn 180 degrees.  Found her exaqm room without assistance,  Psych: Normal affect/speech/language     Assessment & Plan:

## 2016-01-23 NOTE — Assessment & Plan Note (Signed)
Patient not interested in treatment of hypertensio.  Continue to offer each visit.  No new end organ damage.

## 2016-01-23 NOTE — Assessment & Plan Note (Signed)
Established problem that has improved.  Smoking less than a cigarette a week now.  Not interested in stopping completely.  She has to ask family members or caretaker for a cigarette.

## 2016-01-23 NOTE — Assessment & Plan Note (Signed)
FAST Stage: 4 (moderate): needs assistance with iADLs memory loss, inattention and difficulty with complex reasoning  Behavioral and Psychological Symptoms of Dementia: talking to people who are4 not there but not disturbing to patient.  Supportive Interventions for caregiver (support group or individual counseling): Discussed ways of talking with patients with dementia with family   Physical Activity recommendation: Going out to eat with family.  Partipation in local senior center or adult day center.  Follow up surveillance appointment in 3 to 6 months: 4 to 4 months.

## 2016-01-23 NOTE — Assessment & Plan Note (Signed)
Established problem that has improved.  Wt Readings from Last 3 Encounters:  01/22/16 125 lb 9.6 oz (56.972 kg)  10/17/15 117 lb (53.071 kg)  06/19/15 120 lb 4 oz (54.545 kg)   Eating now in social setting with varied diet.

## 2016-03-05 ENCOUNTER — Telehealth: Payer: Self-pay | Admitting: Family Medicine

## 2016-03-05 NOTE — Telephone Encounter (Signed)
May Double Book me for this patient.

## 2016-03-05 NOTE — Telephone Encounter (Signed)
Pt's daughter called stating the pt has lost all reality and is getting worse. First available appointment was 03-25-16, pt's daughter would like to have her seen before then, is it possible to double book so pt can come in sooner? Please advise. Thanks! ep

## 2016-03-08 NOTE — Telephone Encounter (Signed)
Spoke with pts daughter. We have miss Kathryn [name] coming in at 830a on 8/24.

## 2016-03-11 ENCOUNTER — Ambulatory Visit (INDEPENDENT_AMBULATORY_CARE_PROVIDER_SITE_OTHER): Payer: Medicare Other | Admitting: Family Medicine

## 2016-03-11 ENCOUNTER — Encounter: Payer: Self-pay | Admitting: Family Medicine

## 2016-03-11 ENCOUNTER — Ambulatory Visit (HOSPITAL_COMMUNITY)
Admission: RE | Admit: 2016-03-11 | Discharge: 2016-03-11 | Disposition: A | Discharge status: Home / Self Care | Payer: Medicare Other | Source: Ambulatory Visit | Attending: Family Medicine | Admitting: Family Medicine

## 2016-03-11 VITALS — BP 149/70 | HR 95 | Temp 98.0°F | Ht 62.0 in | Wt 124.0 lb

## 2016-03-11 DIAGNOSIS — R0902 Hypoxemia: Secondary | ICD-10-CM | POA: Diagnosis not present

## 2016-03-11 DIAGNOSIS — Z23 Encounter for immunization: Secondary | ICD-10-CM | POA: Diagnosis not present

## 2016-03-11 DIAGNOSIS — F17200 Nicotine dependence, unspecified, uncomplicated: Secondary | ICD-10-CM

## 2016-03-11 DIAGNOSIS — I10 Essential (primary) hypertension: Secondary | ICD-10-CM

## 2016-03-11 DIAGNOSIS — Z79899 Other long term (current) drug therapy: Secondary | ICD-10-CM | POA: Insufficient documentation

## 2016-03-11 DIAGNOSIS — R0602 Shortness of breath: Secondary | ICD-10-CM | POA: Diagnosis present

## 2016-03-11 DIAGNOSIS — R634 Abnormal weight loss: Secondary | ICD-10-CM | POA: Diagnosis not present

## 2016-03-11 DIAGNOSIS — J449 Chronic obstructive pulmonary disease, unspecified: Secondary | ICD-10-CM | POA: Insufficient documentation

## 2016-03-11 DIAGNOSIS — F32A Depression, unspecified: Secondary | ICD-10-CM | POA: Insufficient documentation

## 2016-03-11 DIAGNOSIS — R0600 Dyspnea, unspecified: Secondary | ICD-10-CM | POA: Diagnosis not present

## 2016-03-11 DIAGNOSIS — F329 Major depressive disorder, single episode, unspecified: Secondary | ICD-10-CM | POA: Insufficient documentation

## 2016-03-11 DIAGNOSIS — F0391 Unspecified dementia with behavioral disturbance: Secondary | ICD-10-CM

## 2016-03-11 DIAGNOSIS — F03B18 Unspecified dementia, moderate, with other behavioral disturbance: Secondary | ICD-10-CM

## 2016-03-11 LAB — CBC WITH DIFFERENTIAL/PLATELET
Basophils Absolute: 0 cells/uL (ref 0–200)
Basophils Relative: 0 %
Eosinophils Absolute: 55 cells/uL (ref 15–500)
Eosinophils Relative: 1 %
HCT: 41.3 % (ref 35.0–45.0)
Hemoglobin: 13.7 g/dL (ref 11.7–15.5)
Lymphocytes Relative: 28 %
Lymphs Abs: 1540 cells/uL (ref 850–3900)
MCH: 31.4 pg (ref 27.0–33.0)
MCHC: 33.2 g/dL (ref 32.0–36.0)
MCV: 94.7 fL (ref 80.0–100.0)
MONO ABS: 385 {cells}/uL (ref 200–950)
MPV: 9.2 fL (ref 7.5–12.5)
Monocytes Relative: 7 %
Neutro Abs: 3520 cells/uL (ref 1500–7800)
Neutrophils Relative %: 64 %
Platelets: 203 10*3/uL (ref 140–400)
RBC: 4.36 MIL/uL (ref 3.80–5.10)
RDW: 13.8 % (ref 11.0–15.0)
WBC: 5.5 10*3/uL (ref 3.8–10.8)

## 2016-03-11 LAB — COMPLETE METABOLIC PANEL WITH GFR
ALT: 10 U/L (ref 6–29)
AST: 21 U/L (ref 10–35)
Albumin: 4.4 g/dL (ref 3.6–5.1)
Alkaline Phosphatase: 61 U/L (ref 33–130)
BUN: 16 mg/dL (ref 7–25)
CHLORIDE: 103 mmol/L (ref 98–110)
CO2: 27 mmol/L (ref 20–31)
CREATININE: 0.66 mg/dL (ref 0.60–0.88)
Calcium: 9.6 mg/dL (ref 8.6–10.4)
GFR, Est Non African American: 79 mL/min (ref >=60)
Glucose, Bld: 103 mg/dL — ABNORMAL HIGH (ref 65–99)
Potassium: 4.3 mmol/L (ref 3.5–5.3)
Sodium: 141 mmol/L (ref 135–146)
Total Bilirubin: 0.5 mg/dL (ref 0.2–1.2)
Total Protein: 7 g/dL (ref 6.1–8.1)

## 2016-03-11 LAB — TSH: TSH: 2.47 m[IU]/L

## 2016-03-11 MED ORDER — TIOTROPIUM BROMIDE MONOHYDRATE 18 MCG IN CAPS: 18.0000 ug | ORAL_CAPSULE | Freq: Every day | RESPIRATORY_TRACT | 12 refills | Status: AC

## 2016-03-11 MED ORDER — MIRTAZAPINE 7.5 MG PO TABS: ORAL_TABLET | ORAL | 1 refills | Status: DC

## 2016-03-11 NOTE — Assessment & Plan Note (Signed)
New problem See Hypoxemia problem for today

## 2016-03-11 NOTE — Assessment & Plan Note (Signed)
New problem After discussion with Dgt, will try trial of Mirtazapine titrate to 15 mg qhs with target behavior of sleep and expressions of sadness and thoughts of death and participation in more activities after two months of effective dose.

## 2016-03-11 NOTE — Assessment & Plan Note (Signed)
New problem Rest SaO2 (RA) = 90% and Exertional SaO2 (RA) = 80% without complaint of dyspnea EKG without evidence of cardiac chamber enlargement Working explanation is COPD Will check BNP CXR Start Spiriva handihaler one dose daily.  RTC 4 weeks Recheck rest and exertional SaO2 then supplement with Oxygen and test exertion and rest to see if qualify for home and portable oxygen.

## 2016-03-11 NOTE — Assessment & Plan Note (Signed)
Established problem that has improved.  Ms Elk not smoked in several weeks though she has not formally quit.

## 2016-03-11 NOTE — Patient Instructions (Addendum)
Your dementia has gotten worse with further decline in your thinking ability  You have depressive symptoms which may improve with medication.  Start this antidepressant medication, Mirtazapine (Remeron) - take one tablet daily at bedtime for one week, then increase to taking two tablets at bedtime daily until you see Dr McDiarmid again.  Your oxygen level in your blood dropped while you were walking with Dr McDiarmid.  He thinks it may be due to your history of smoking cigarettes that have caused COPD (Chronic Obstructive Pulmonary Disease).  Start this breathing medicine called Spiriva (tiotropium) to open up your airways and increase the amount of oxygen getting into your blood stream.  You may gain some energy if there is more oxygen in your blood stream to feed your body.   Go for a Chest Xray at any of the [facility] sites.  This Chest Xray will help Dr McDiarmid look at your lungs to see if what may be causing you to not get enough oxygen in your blood from your lungs.   Dr McDiarmid will call you if your tests are not good. Otherwise he will send you a letter.  If you sign up for MyChart online, you will be able to see your test results once Dr McDiarmid has reviewed them.  If you do not hear from [place] with in 2 weeks please call our office

## 2016-03-11 NOTE — Assessment & Plan Note (Signed)
Established problem that has improved.  Wt Readings from Last 3 Encounters:  03/11/16 124 lb (56.2 kg)  01/22/16 125 lb 9.6 oz (57 kg)  10/17/15 117 lb (53.1 kg)   Declining to go out to eat with her nursing assistant

## 2016-03-11 NOTE — Assessment & Plan Note (Signed)
Established problem. Stable. Continue monitor

## 2016-03-11 NOTE — Assessment & Plan Note (Signed)
Established problem worsened.  Decline in cognitive function.  MoCA down to 5/30 today.  MMSE was 19 / 30 just about a year ago.   FAST Stage: 5 (Moderate or mid-stage Alzheimer's disease). Change from Stage 4 09/2015.  memory loss, inattention, slow information processing, difficulty with abstract concepts and difficulty with complex reasoning decreased initiation, decreased attention and decreased problem solving Palliative Performance Score: 60%.   Behavioral and Psychological Symptoms of Dementia: Depressed mood, non-threatening hallucinations of dogs and thoughts that her personal items are being stolen( though not accusatory).  Inappropriate behaviors of verbally abusive towards her daughter (but not her son-in-law) but no physical violence.  Plan trial of Mirtazapine titrate to 15 mg.   Discussed possible role of AChEI drugs in Behavioral Psychological Symptoms of Dementia, dgt declined trial after discussing possible ADEs and small mean benefit on cognition and function.   RTC 2 months to access benefits of Mirtazopine target behaviors.

## 2016-03-11 NOTE — Progress Notes (Signed)
Patient is accompanied by: daughter and son-in-law Primary caregiver: daughter, son-in-law and nursing assistant named Gay Filler.  Patient's lives with their family. Patient information was obtained from patient, relative(s) and past medical records. History/Exam limitations: dementia. Primary Care Provider: Timmothy Baranowski D, MD  Reason for referral:  Chief Complaint  Patient presents with  . Dementia   History Chief Complaint  Patient presents with  . Dementia  Family is concerned that Ms Ferdon is showing gradual progression of her cognitive  impairments from March 2017 visit with patient spending more time in her room, declining to go outside her home for activities, worsening confusion and disorientation.   She frequently stops in mid-sentence, not able to recall where she was going with her talk.   She has been caring for two imaginary dogs in addition to her real dogs.  The imagined dogs are not threatening to her. It is not uncommon for her to believe small personal items have been stolen when she cannot locate them.     She has episodes of crying, stating she does not know why she is still living, talks about visiting long dead relatives.      She is going to bed around 7: 00 pm each night, then rummaging in her closet around 1 am most nights, then staying in bed until 9 am.  She is not napping during the day   There is not significant variation in cognition or level of consciousness throughout the day.  No tremors. No falls. No decline in appetite.   What problems with thinking are there? memory loss, slow information processing, difficulty with abstract concepts and difficulty with complex reasoning  Is their speech disorganized, rambling? yes  Has there been any tremors or abnormal movements? no   Decrease Level of Activity - Onset in June, no evaluated yet by provider - Progressive course - Little variation day to day.  Pt declining to go out with her caretaker any  longer which she used to do regularly. - Pt continues to smoke, but it has been several weeks since she has asked for a cigarette.  - No DOE, no orthopnea, no PND   SH: (+) smoking  Review of Systems  Constitutional: Negative for fever and weight loss.  Respiratory: Negative for cough and wheezing.   Cardiovascular: Negative for chest pain, orthopnea and leg swelling.  Gastrointestinal: Negative for abdominal pain and constipation.  Genitourinary: Positive for urgency. Negative for dysuria and frequency.  Musculoskeletal: Positive for back pain.  Neurological: Negative for dizziness.  Psychiatric/Behavioral: Positive for depression. Negative for suicidal ideas.   Physical Exam  Constitutional: No distress.  HENT:  Right Ear: External ear normal.  Left Ear: External ear normal.  Eyes:  Right pupil irregular with superior iris defect superiorly  Neck: No thyromegaly present.  Cardiovascular: Normal rate, regular rhythm and normal heart sounds.   Pulmonary/Chest: Effort normal and breath sounds normal. No respiratory distress. She has no wheezes. She has no rales.  Kyphotic thorax  Abdominal: Soft. Bowel sounds are normal. She exhibits no distension.  Lymphadenopathy:    She has no cervical adenopathy.  Neurological: She is alert. She has normal strength.  Reflex Scores:      Patellar reflexes are 0 on the right side and 0 on the left side. No rigidity.  No spasticity  VS reviewed GEN: Alert, Cooperative, Groomed, NAD  EKG: NSR, non-specific ST & T wave changes    Outpatient Encounter Prescriptions as of 03/11/2016  Medication Sig  .  acetaminophen (TYLENOL) 500 MG tablet Take 500 mg by mouth every 6 (six) hours as needed.  . Ascorbic Acid (VITAMIN C) 500 MG tablet    . Calcium Citrate-Vitamin D (CALCIUM + D) 315-200 MG-UNIT per tablet    . cholecalciferol (VITAMIN D) 400 UNITS TABS Take 400 Units by mouth.  . mirtazapine (REMERON) 7.5 MG tablet Take one tablet by  daily for  one week, then two tablets dai  . Multiple Vitamins-Minerals (CENTRUM SILVER ULTRA WOMENS) TABS Take 1 tablet by mouth daily.  Marland Kitchen tiotropium (SPIRIVA HANDIHALER) 18 MCG inhalation capsule Place 1 capsule (18 mcg total) into inhaler and inhale daily.   No facility-administered encounter medications on file as of 03/11/2016.    History Patient Active Problem List   Diagnosis Date Noted  . Frailty 10/30/2015    Priority: High  . Impaired functional mobility, balance, gait, and endurance 10/17/2015    Priority: Medium  . Loss of weight 09/19/2014    Priority: Medium  . At high risk for falls 10/11/2012    Priority: Medium  . Moderate dementia with behavioral disturbance 07/25/2012    Priority: Medium  . Spinal stenosis of lumbar region 11/11/2009    Priority: Medium  . HYPERTENSION, BENIGN ESSENTIAL 12/22/2007    Priority: Medium  . TOBACCO DEPENDENCE 09/15/2006    Priority: Medium  . Sensory hearing loss, unilateral 04/01/2011    Priority: Low  . HYPERCHOLESTEROLEMIA 09/15/2006    Priority: Low  . OSTEOARTHRITIS, LOWER LEG 09/15/2006    Priority: Low  . Osteoporosis 09/15/2006    Priority: Low  . Dyspnea 03/11/2016  . Hypoxemia 03/11/2016  . High risk medication use 03/11/2016  . Depression 03/11/2016  . COPD, moderate ([place]) 03/11/2016   Past Medical History:  Diagnosis Date  . Basal cell cancer    right malar face region, Dr Allyson Sabal  . Dementia 01/2015  . Dextromethorphan use disorder, mild, abuse 03/28/2015  . Distal radial fracture 08/17/2012  . History of basal cell cancer 04/30/2010   excisional biopsy by Dr Allyson Sabal ([facility]) in Fall 2011.     Marland Kitchen Hypertension   . Impaired functional mobility, balance, gait, and endurance 10/17/2015  . Macrocytosis without anemia 09/23/2014   Results for LYN, KOMM (MRN [id number]) as of 09/23/2014 12:56  Ref. Range 07/24/2012 16:03  Vitamin B-12 Latest Range: 211-911 pg/mL 591    Ref. Range 07/24/2012 16:03  Folate Latest Units: ng/mL >20.0    Ref.  Range 09/19/2014 10:53  TSH Latest Range: 0.350-4.500 uIU/mL 2.507  Results for KEMYRA, WESTERKAMP (MRN [id number]) as of 09/23/2014 12:56  Ref. Range 09/19/2014 10:53  Alkaline Phosphatase Latest Range: 3  . Mild cognitive impairment with memory loss 03/09/2013  . Mild dementia 07/25/2012   MMSE 17/16 January 2015 remains independent in iADLs.  MMSE 25/17 August 2012.  Independent in iADLs.    . Orthostatic hypotension 08/17/2012  . Osteoarthritis of hip   . Osteoarthritis of right knee 10/2000   Xray: narrowing medial knee and patella spurring  . Sacroiliitis (HCC)    right sacroiliac joint.   . Shingles 04/2009  . Spinal stenosis of lumbar region 11/11/2009   thoraco lumbar scoliosis since youth.     . Treadmill stress test negative for angina pectoris 05/2000   Adequate, low probability CAD   Past Surgical History:  Procedure Laterality Date  . [place] I PROCEDURE     for Ulcer, partial gastrectomy  . CATARACT EXTRACTION W/ INTRAOCULAR LENS IMPLANT  06/2008   [place]  [facility], left eye  . CATARACT EXTRACTION W/ INTRAOCULAR LENS IMPLANT  11/2008   [facility], right eye  . ORIF HIP FRACTURE  06/2009   Dr French Ana (ortho)   Family History  Problem Relation Age of Onset  . Heart disease Mother   . Heart disease Father    Social History   Social History  . Marital status: Widowed    Spouse name: N/A  . Number of children: 3  . Years of education: 76   Social History Main Topics  . Smoking status: Current Some Day Smoker    Packs/day: 0.25    Years: 40.00  . Smokeless tobacco: Never Used  . Alcohol use No  . Drug use:   . Sexual activity: No   Other Topics Concern  . None   Social History Narrative   Lives with her dgt and Son-in-law in [place]    Patient's dog are very important to her.      Patient stated she desired a Do Not Attempt Resuscitation status (03/08/13 PCP office visit)   Patient designated her daughter as the person to speak for her if she  was unable to speak for herself (03/08/13).     Family History of Dementia: No -   Basic Activities of Daily Living  ADLs Independent Needs Assistance Dependent  Bathing x    Dressing x    Ambulation x    Toileting x    Eating x     Instrumental Activities of Daily Living IADL Independent Needs Assistance Dependent  Cooking   x  Housework   x  Manage Medications   x  Manage the telephone   x  Shopping for food, clothes, Meds, etc   x  Use transportation   x  Manage Finances   x    Caregivers in home: daughter and son-in-law and nursing assistant  Caregiver Burdens: yes, dgt   FALLS in last five office visits:  Fall Risk  03/11/2016 01/22/2016 10/17/2015 06/19/2015 03/27/2015  Falls in the past year? No No Yes No No  Number falls in past yr: - - - - -  Injury with Fall? - - - - -  Risk Factor Category  - - - - -  Risk for fall due to : - - - - History of fall(s)  Risk for fall due to (comments): - - - - -    Health Maintenance reviewed: Immunization History  Administered Date(s) Administered  . Influenza Split 04/01/2011, 04/17/2012  . Influenza Whole 04/15/2008, 05/15/2009, 04/30/2010  . Influenza,inj,Quad PF,36+ Mos 04/11/2014, 03/27/2015, 03/11/2016, 03/11/2016  . Influenza-Unspecified 05/07/2013  . Pneumococcal Conjugate-13 11/01/2013  . Pneumococcal Polysaccharide-23 10/17/1996  . Td 10/17/1996, 04/15/2008   Health Maintenance Topics with due status: Due On     Topic Date Due   INFLUENZA VACCINE 02/17/2016    Diet: Regular Nutritional supplements: Ensure High Pro  Geriatric Syndromes: Constipation no ,   Incontinence yes  Dizziness no   Syncope no   Skin problems no   Visual Impairment no   Hearing impairment yes  Eating impairment no  Impaired Memory or Cognition yes   Behavioral problems yes   Sleep problems yes   Weight loss no      Vital Signs Weight: 124 lb (56.2 kg) Body mass index is 22.68 kg/m. CrCl cannot be calculated (Patient's most  recent lab result is older than the maximum 21 days allowed.). Body surface area is 1.57 meters squared.  Vitals:   03/11/16 0831 03/11/16 0835 03/11/16 1408  BP: (!) 164/70 (!) 149/70   Pulse: 95    Temp: 98 F (36.7 C)    TempSrc: Oral    SpO2: 99% 90% (!) 80%  Weight: 124 lb (56.2 kg)    Height: 5\' 2"  (1.575 m)     Wt Readings from Last 3 Encounters:  03/11/16 124 lb (56.2 kg)  01/22/16 125 lb 9.6 oz (57 kg)  10/17/15 117 lb (53.1 kg)   No exam data present  Physical Examination:  VS reviewed GEN: Alert, Cooperative, Groomed, NAD      Mini-Mental State Examination or Montreal Cognitive Assessment:  Patient did  require additional cues or prompts to complete tasks. Patient was cooperative and attentive to testing tasks Patient did  appear motivated to perform well  MMSE - Mini Mental State Exam 03/27/2015 01/16/2015 11/01/2013 06/22/2013 08/17/2012  Orientation to time 4 3 5 5 3   Orientation to time comments - - - 5 date 70, month Feb  Orientation to Place 4 3 4 4 2   Orientation to Place-comments - - - - did not know county or state   Registration 3 3 3 3 3   Attention/ Calculation 1 1 0 0 4  Recall 0 0 2 1 2   Language- name 2 objects 2 2 2 2 2   Language- repeat 1 0 1 1 1   Language- follow 3 step command 3 3 3 3 3   Language- read & follow direction 0 1 1 1 1   Write a sentence 1 1 1 1 1   Copy design 0 0 0 0 1  Total score 19 17 22 21 23         Montreal Cognitive Assessment  03/11/2016  Visuospatial/ Executive (0/5) 1  Naming (0/3) 1  Attention: Read list of digits (0/2) 0  Attention: Read list of letters (0/1) 0  Attention: Serial 7 subtraction starting at 100 (0/3) 0  Language: Repeat phrase (0/2) 0  Language : Fluency (0/1) 0  Abstraction (0/2) 0  Delayed Recall (0/5) 0  Orientation (0/6) 2  Total 4  Adjusted Score (based on education) 5    Geriatric Depression Scale:  6 / 15  Labs  Lab Results  Component Value Date   VITAMINB12 436 12/05/2014     Lab Results  Component Value Date   FOLATE >20.0 12/05/2014    Lab Results  Component Value Date   TSH 2.507 09/19/2014    No results found for: RPR    Chemistry      Component Value Date/Time   NA 140 09/19/2014 1053   K 3.9 09/19/2014 1053   CL 106 09/19/2014 1053   CO2 25 09/19/2014 1053   BUN 16 09/19/2014 1053   CREATININE 0.70 09/19/2014 1053      Component Value Date/Time   CALCIUM 9.5 09/19/2014 1053   ALKPHOS 43 09/19/2014 1053   AST 21 09/19/2014 1053   ALT 8 09/19/2014 1053   BILITOT 0.6 09/19/2014 1053       No results found for: HGBA1C    Lab Results  Component Value Date   WBC 5.8 12/05/2014   HGB 13.9 12/05/2014   HCT 40.4 12/05/2014   MCV 95.7 12/05/2014   PLT 180 12/05/2014    No results found for this or any previous visit (from the past 24 hour(s)).    Assessment and Plan: Problem List Items Addressed This Visit      Medium   TOBACCO DEPENDENCE  Loss of weight   HYPERTENSION, BENIGN ESSENTIAL (Chronic)     Unprioritized   Hypoxemia   Relevant Orders   DG Chest 2 View   Brain natriuretic peptide   High risk medication use   Relevant Orders   COMPLETE METABOLIC PANEL WITH GFR   TSH   Dyspnea   Relevant Orders   TSH   DG Chest 2 View   Brain natriuretic peptide   Depression   Relevant Medications   mirtazapine (REMERON) 7.5 MG tablet   Other Relevant Orders   COMPLETE METABOLIC PANEL WITH GFR   TSH   COPD, moderate (HCC)   Relevant Medications   tiotropium (SPIRIVA HANDIHALER) 18 MCG inhalation capsule   Other Relevant Orders   DG Chest 2 View   Brain natriuretic peptide    Other Visit Diagnoses    Shortness of breath    -  Primary   Relevant Orders   EKG 12-Lead (Completed)   CBC with Differential   Encounter for immunization       Relevant Orders   Flu Vaccine QUAD 36+ mos IM (Completed)   Flu Vaccine QUAD 36+ mos IM (Completed)      Personal Strengths Active sense of humor Supportive  family/friends  Support System Strengths Supportive Relationships and nursing assistant  Advanced Directives: Code Status: DNR   Montreal Cognitive Assessment  03/11/2016  Visuospatial/ Executive (0/5) 1  Naming (0/3) 1  Attention: Read list of digits (0/2) 0  Attention: Read list of letters (0/1) 0  Attention: Serial 7 subtraction starting at 100 (0/3) 0  Language: Repeat phrase (0/2) 0  Language : Fluency (0/1) 0  Abstraction (0/2) 0  Delayed Recall (0/5) 0  Orientation (0/6) 2  Total 4  Adjusted Score (based on education) 5  Contact: First and Last Name if other than the patient involved: Andre Lefort [id number]) [phone] (home)    Patient to Follow up with [email or web address] @ or [facility]  in 2 month(s)

## 2016-03-12 LAB — BRAIN NATRIURETIC PEPTIDE: BRAIN NATRIURETIC PEPTIDE: 53.8 pg/mL (ref <100)

## 2016-03-22 ENCOUNTER — Telehealth: Payer: Self-pay | Admitting: Family Medicine

## 2016-03-22 NOTE — Telephone Encounter (Signed)
Family Medicine After hours phone call  The patient's daughter is calling today due to some worsening symptoms in her mother since initiating "a new medication at her last visit". Going through the notes it appears as though patient was placed on mirtazapine 15 mg for depression and insomnia. Patient's daughter endorses worsening insomnia and visual/auditory hallucinations. Patient is afebrile, awake, alert, and appears close to her baseline with the exception of insomnia and hallucinations (according to daughter).  Daughter is asking if she can discontinue medication safely. She states patient has only taken the first 4 days of medication. Doses have been half the targeted dose of 15 mg (so 7.5 mg).   - I informed patient's daughter that is okay to discontinue this medicine at this time. - I informed her that if her mothers mental status continues to be concerning, or worsens, then it is 100% appropriate to have her evaluated in the ED. As it is difficult for me to fully assess patient over the phone. - Contact office will morning to obtain an appt w/ PCP or SDA. - Encouraged evaluation at ED if there is any doubt at all and the daughter's mind of this advice. - Daughter stated her understanding.   Elberta Leatherwood, MD,MS,  PGY3 03/22/2016 9:19 AM

## 2016-03-23 ENCOUNTER — Telehealth: Payer: Self-pay | Admitting: Family Medicine

## 2016-03-23 NOTE — Telephone Encounter (Signed)
Daughter called and really needs to speak to Dr. McDiarmid about her mother who is taking a turn for the worse and she wants to see if she is doing the correct. ThingBlima [name]

## 2016-03-24 MED ORDER — HALOPERIDOL 0.5 MG PO TABS: ORAL_TABLET | ORAL | 0 refills | Status: AC

## 2016-03-25 ENCOUNTER — Ambulatory Visit: Payer: Medicare Other | Admitting: Family Medicine

## 2016-03-30 ENCOUNTER — Encounter: Payer: Self-pay | Admitting: Family Medicine

## 2016-03-30 DIAGNOSIS — R54 Age-related physical debility: Secondary | ICD-10-CM

## 2016-03-30 NOTE — Assessment & Plan Note (Signed)
Patient died on [id number] dur to complications from dementia.

## 2016-04-18 NOTE — Telephone Encounter (Signed)
Daughter is calling back to speak to the doctor. It is very important. jw

## 2016-04-18 DEATH — deceased

## 2016-07-24 IMAGING — CR DG LUMBAR SPINE 2-3V
3 series · 3 of 3 positions shown · non-contrast
Comparison: Lumbar radiographs dated 04/28/2009 and lumbar MRI
dated 02/25/2010

CLINICAL DATA: Right-sided lumbar pain and right hip pain since a
fall 1 year ago.

EXAM:
LUMBAR SPINE - 2-3 VIEW

[view not recorded (1 of 3)]
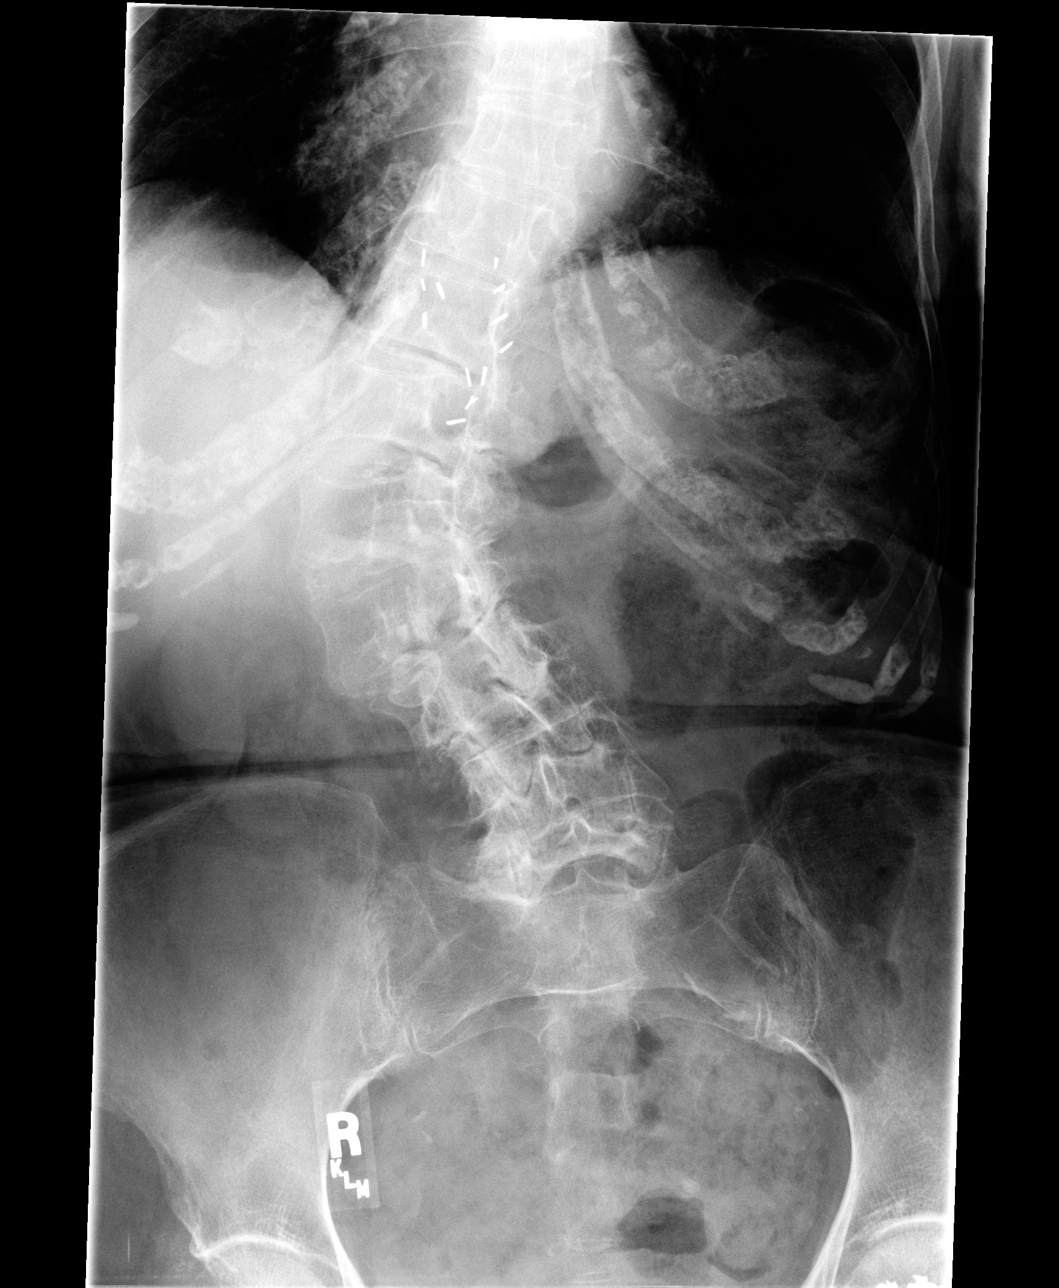

[view not recorded (2 of 3)]
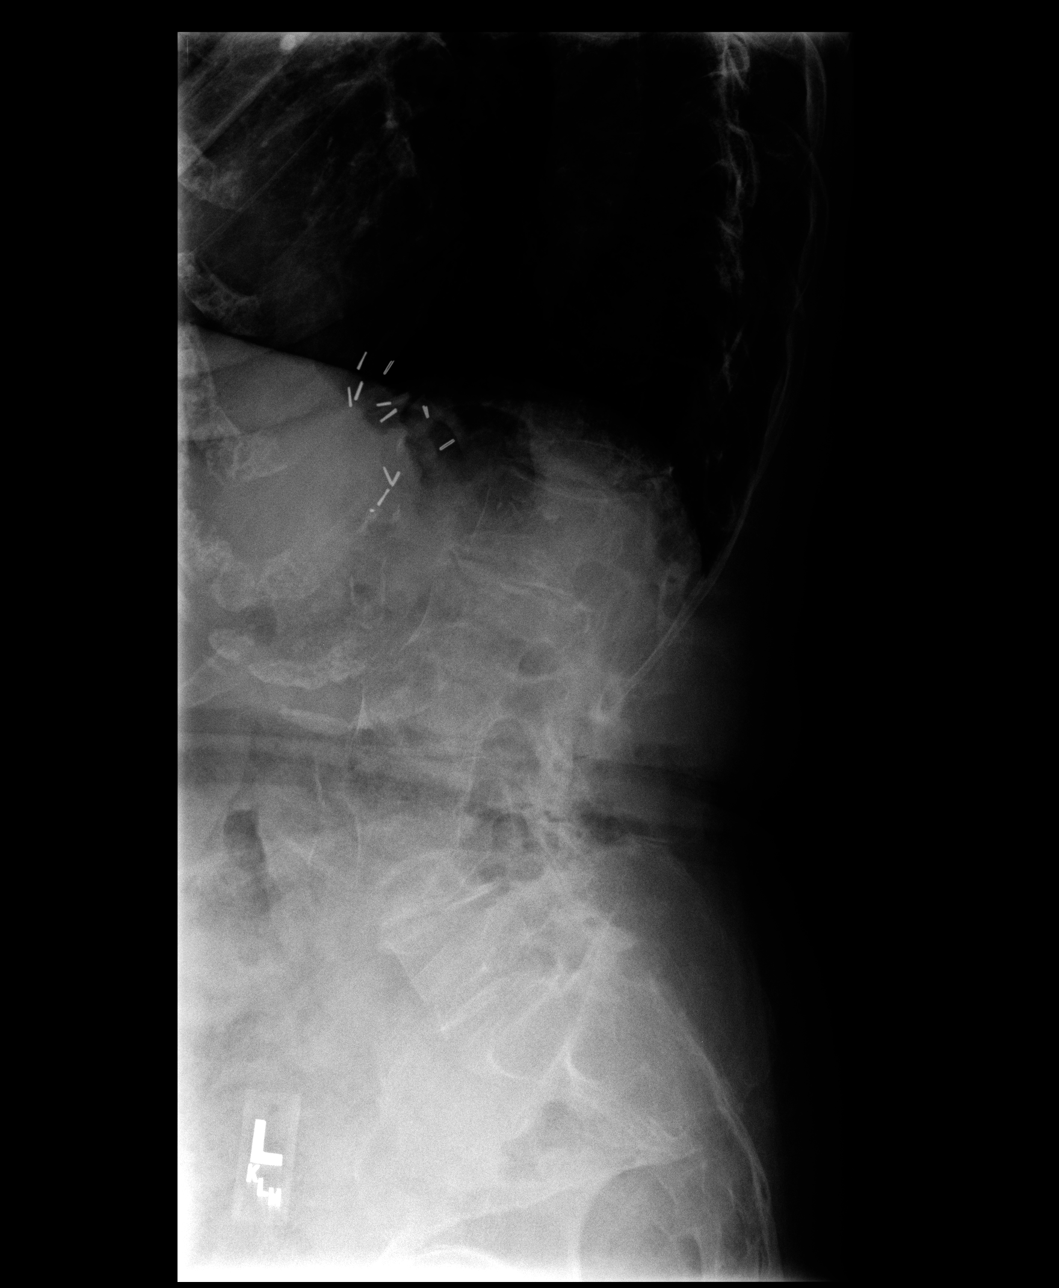

[view not recorded (3 of 3)]
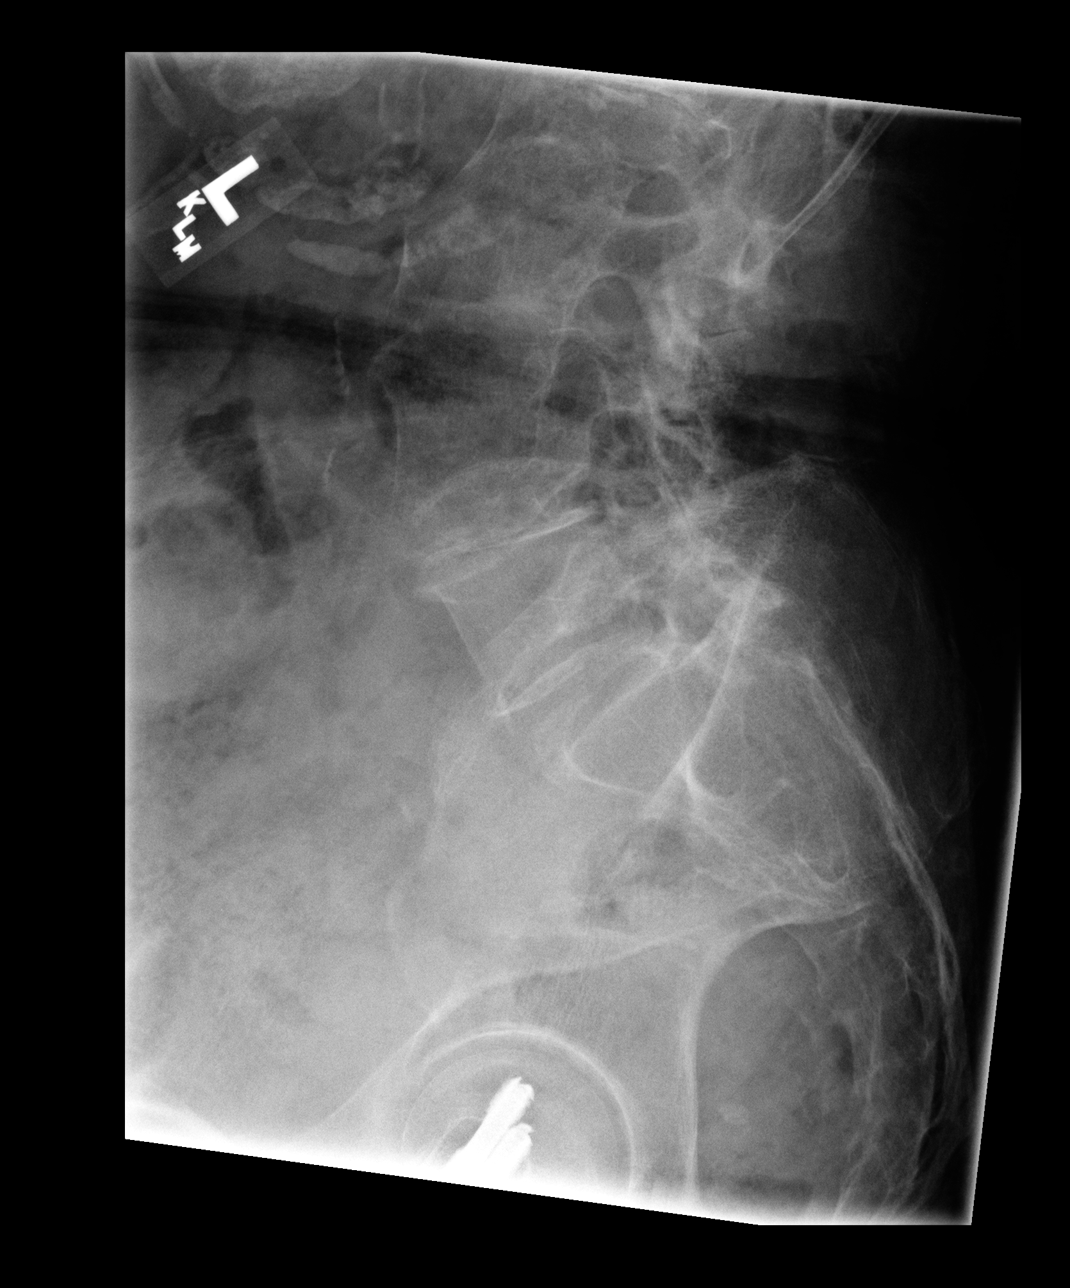

[3 of 3 positions shown; findings below may reference images not displayed]

FINDINGS: There is a severe rotoscoliosis of the thoracolumbar spine centered
at L1 with convexity to the right. There is multilevel degenerative
disc and joint disease, progressed since 04/28/2009. There is a 2 cm
diameter ovoid calcification in the mid abdomen which may represent
a gallstone or calcified mesenteric lymph node. With benefit of
retrospection it was present on 04/28/2009 and appears unchanged. No
fractures or bone destruction.
IMPRESSION: Progressive severe scoliosis of the thoracolumbar spine with
progressive degenerative disc and joint disease.
